# Patient Record
Sex: Female | Born: 1990
Health system: Southern US, Community
[De-identification: ages and names within clinical notes are randomized; demographics above are authoritative.]

## PROBLEM LIST (undated history)

## (undated) ENCOUNTER — Emergency Department (HOSPITAL_BASED_OUTPATIENT_CLINIC_OR_DEPARTMENT_OTHER): Admission: EM | Payer: Self-pay

## (undated) DIAGNOSIS — G43909 Migraine, unspecified, not intractable, without status migrainosus: Secondary | ICD-10-CM

## (undated) DIAGNOSIS — F32A Depression, unspecified: Secondary | ICD-10-CM

## (undated) HISTORY — PX: WISDOM TOOTH EXTRACTION: SHX21

---
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
## 2015-09-26 ENCOUNTER — Emergency Department (HOSPITAL_BASED_OUTPATIENT_CLINIC_OR_DEPARTMENT_OTHER)
Admission: EM | Admit: 2015-09-26 | Discharge: 2015-09-26 | Disposition: A | Discharge status: Home / Self Care | Payer: Medicaid Other | Attending: Emergency Medicine | Admitting: Emergency Medicine

## 2015-09-26 ENCOUNTER — Encounter (HOSPITAL_BASED_OUTPATIENT_CLINIC_OR_DEPARTMENT_OTHER): Payer: Self-pay | Admitting: Emergency Medicine

## 2015-09-26 ENCOUNTER — Emergency Department (HOSPITAL_BASED_OUTPATIENT_CLINIC_OR_DEPARTMENT_OTHER): Payer: Medicaid Other

## 2015-09-26 DIAGNOSIS — Z79899 Other long term (current) drug therapy: Secondary | ICD-10-CM | POA: Insufficient documentation

## 2015-09-26 DIAGNOSIS — J988 Other specified respiratory disorders: Secondary | ICD-10-CM | POA: Diagnosis not present

## 2015-09-26 DIAGNOSIS — R0981 Nasal congestion: Secondary | ICD-10-CM | POA: Diagnosis present

## 2015-09-26 DIAGNOSIS — R059 Cough, unspecified: Secondary | ICD-10-CM

## 2015-09-26 DIAGNOSIS — R05 Cough: Secondary | ICD-10-CM

## 2015-09-26 DIAGNOSIS — R079 Chest pain, unspecified: Secondary | ICD-10-CM | POA: Insufficient documentation

## 2015-09-26 DIAGNOSIS — F17210 Nicotine dependence, cigarettes, uncomplicated: Secondary | ICD-10-CM | POA: Insufficient documentation

## 2015-09-26 MED ORDER — GUAIFENESIN 100 MG/5ML PO LIQD: 100.0000 mg | ORAL | Status: DC | PRN

## 2015-09-26 NOTE — ED Notes (Signed)
Updated patient about delay.  Offered drinks but patient reports she is ok.  Verbalized understanding. No distress noted.

## 2015-09-26 NOTE — Discharge Instructions (Signed)

## 2015-09-26 NOTE — ED Provider Notes (Signed)
CSN: [id number]     Arrival date & time 09/26/15  1213 History   First MD Initiated Contact with Patient 09/26/15 1336     Chief Complaint  Patient presents with  . Nasal Congestion   HPI  Mr. Montez MoritaCarter is a 24 year old female presenting with cough. She she reports a productive cough that began yesterday. The cough is productive of yellow sputum. She states that occasionally she coughs so hard that her chest hurts and she feels short of breath. She also notes that she recently had a prolonged sinus infection. She states that it was present for almost 2 weeks and resolved about a week ago. She reports sinus congestion and rhinorrhea with sinus infection but states he is not painting these any longer. She is only complaining of the cough today. She is not having any underlying pulmonary issues. She does smoke approximately one half pack of cigarettes a day. She states that she has continued to smoke though she is sick. Denies fevers, chills, headaches, ear pain, congestion, rhinorrhea, sore throat, neck pain, abdominal pain, nausea or vomiting.   History reviewed. No pertinent past medical history. History reviewed. No pertinent past surgical history. History reviewed. No pertinent family history. Social History  Substance Use Topics  . Smoking status: Current Every Day Smoker -- 0.50 packs/day    Types: Cigarettes  . Smokeless tobacco: None  . Alcohol Use: No   OB History    No data available     Review of Systems  Constitutional: Negative for fever and chills.  HENT: Negative for congestion, ear pain, rhinorrhea, sinus pressure and sore throat.   Eyes: Negative for discharge and redness.  Respiratory: Positive for cough and shortness of breath.   Cardiovascular: Positive for chest pain.  Gastrointestinal: Negative for nausea, vomiting and abdominal pain.  Musculoskeletal: Negative for myalgias and arthralgias.  Skin: Negative for rash.  Neurological: Negative for dizziness, syncope,  weakness and headaches.  All other systems reviewed and are negative.     Allergies  Review of patient's allergies indicates no known allergies.  Home Medications   Prior to Admission medications   Medication Sig Start Date End Date Taking? Authorizing Provider  dextromethorphan-guaiFENesin (MUCINEX DM) 30-600 MG 12hr tablet Take 1 tablet by mouth 2 (two) times daily.   Yes Historical Provider, MD  guaiFENesin (ROBITUSSIN) 100 MG/5ML liquid Take 5-10 mLs (100-200 mg total) by mouth every 4 (four) hours as needed for cough. 09/26/15   Jaeshawn Silvio, PA-C   BP 134/78 mmHg  Pulse 73  Temp(Src) 98.1 F (36.7 C) (Oral)  Resp 16  Ht 5\' 3"  (1.6 m)  Wt 150 lb (68.04 kg)  BMI 26.58 kg/m2  SpO2 100% Physical Exam  Constitutional: She appears well-developed and well-nourished. No distress.  Nontoxic-appearing  HENT:  Head: Normocephalic and atraumatic.  Nose: Mucosal edema present. No rhinorrhea. Right sinus exhibits no maxillary sinus tenderness and no frontal sinus tenderness. Left sinus exhibits no maxillary sinus tenderness and no frontal sinus tenderness.  Mouth/Throat: Oropharynx is clear and moist. No oropharyngeal exudate.  Eyes: Conjunctivae and EOM are normal. Pupils are equal, round, and reactive to light. Right eye exhibits no discharge. Left eye exhibits no discharge. No scleral icterus.  Neck: Normal range of motion. Neck supple.  Cardiovascular: Normal rate, regular rhythm and normal heart sounds.   Pulmonary/Chest: Effort normal and breath sounds normal. No respiratory distress. She has no wheezes. She has no rales. She exhibits no tenderness.  Breathing unlabored. No adventitious  breath sounds.  Abdominal: Soft. She exhibits no distension.  Musculoskeletal: Normal range of motion.  Moves all extremities spontaneously and walks with a steady gait  Lymphadenopathy:    She has no cervical adenopathy.  Neurological: She is alert. Coordination normal.  Skin: Skin is warm  and dry. No rash noted.  Psychiatric: She has a normal mood and affect. Her behavior is normal.  Nursing note and vitals reviewed.   ED Course  Procedures (including critical care time) Labs Review Labs Reviewed - No data to display  Imaging Review Dg Chest 2 View  09/26/2015  CLINICAL DATA:  Productive cough EXAM: CHEST  2 VIEW COMPARISON:  Report from 10/11/2002 chest radiograph (images not available). FINDINGS: Normal heart size. Normal mediastinal contour. No pneumothorax. No pleural effusion. Clear lungs, with no focal lung consolidation and no pulmonary edema. IMPRESSION: No active cardiopulmonary disease. Electronically Signed   By: Delbert Phenix M.D.   On: 09/26/2015 14:53   I have personally reviewed and evaluated these images and lab results as part of my medical decision-making.   EKG Interpretation None      MDM   Final diagnoses:  Cough  Respiratory infection   Patient presenting with cough 1 day. She states that occasionally she coughs so hard she gets short of breath and has chest pain. Symptoms resolved when she is done coughing. Recent history of a sinus infection that resolved about a week ago. Symptoms today are not similar to symptoms of her sinus infection. No other complaints today. Vital signs stable. Patient appears in no acute distress and is nontoxic. Nasal mucosal edema present. Oropharynx is clear without erythema or exudate. Breathing unlabored with normal lung sounds in all lung fields. Pt CXR negative for acute infiltrate. Patients symptoms are consistent with URI, likely viral etiology. Discussed that antibiotics are not indicated for viral infections. Pt will be discharged with symptomatic treatment including Robitussin.  Verbalizes understanding and is agreeable with plan. Pt is hemodynamically stable & in NAD prior to dc.     Rolm Gala Etola Mull, PA-C 09/26/15 1802  Vanetta Mulders, MD 09/27/15 1241

## 2015-09-26 NOTE — ED Notes (Signed)
Pt reports nasal congestion and cough x 3 days, states she just go over a severe sinus infection last week and within 3 days it returned

## 2017-08-14 ENCOUNTER — Encounter (HOSPITAL_BASED_OUTPATIENT_CLINIC_OR_DEPARTMENT_OTHER): Payer: Self-pay | Admitting: *Deleted

## 2017-08-14 ENCOUNTER — Emergency Department (HOSPITAL_BASED_OUTPATIENT_CLINIC_OR_DEPARTMENT_OTHER)
Admission: EM | Admit: 2017-08-14 | Discharge: 2017-08-14 | Disposition: A | Discharge status: Home / Self Care | Payer: Self-pay | Attending: Emergency Medicine | Admitting: Emergency Medicine

## 2017-08-14 ENCOUNTER — Emergency Department (HOSPITAL_BASED_OUTPATIENT_CLINIC_OR_DEPARTMENT_OTHER): Payer: Self-pay

## 2017-08-14 DIAGNOSIS — B9689 Other specified bacterial agents as the cause of diseases classified elsewhere: Secondary | ICD-10-CM | POA: Insufficient documentation

## 2017-08-14 DIAGNOSIS — R509 Fever, unspecified: Secondary | ICD-10-CM | POA: Insufficient documentation

## 2017-08-14 DIAGNOSIS — R0981 Nasal congestion: Secondary | ICD-10-CM | POA: Insufficient documentation

## 2017-08-14 DIAGNOSIS — J069 Acute upper respiratory infection, unspecified: Secondary | ICD-10-CM | POA: Insufficient documentation

## 2017-08-14 DIAGNOSIS — R110 Nausea: Secondary | ICD-10-CM | POA: Insufficient documentation

## 2017-08-14 DIAGNOSIS — N760 Acute vaginitis: Secondary | ICD-10-CM | POA: Insufficient documentation

## 2017-08-14 DIAGNOSIS — F17210 Nicotine dependence, cigarettes, uncomplicated: Secondary | ICD-10-CM | POA: Insufficient documentation

## 2017-08-14 DIAGNOSIS — R1031 Right lower quadrant pain: Secondary | ICD-10-CM | POA: Insufficient documentation

## 2017-08-14 LAB — CBC WITH DIFFERENTIAL/PLATELET
BASOS ABS: 0 10*3/uL (ref 0.0–0.1)
Basophils Relative: 0 %
EOS ABS: 0.1 10*3/uL (ref 0.0–0.7)
EOS PCT: 2 %
HCT: 37.9 % (ref 36.0–46.0)
HEMOGLOBIN: 12.5 g/dL (ref 12.0–15.0)
LYMPHS ABS: 1.6 10*3/uL (ref 0.7–4.0)
Lymphocytes Relative: 18 %
MCH: 29.7 pg (ref 26.0–34.0)
MCHC: 33 g/dL (ref 30.0–36.0)
MCV: 90 fL (ref 78.0–100.0)
Monocytes Absolute: 0.8 10*3/uL (ref 0.1–1.0)
Monocytes Relative: 9 %
NEUTROS PCT: 71 %
Neutro Abs: 6.3 10*3/uL (ref 1.7–7.7)
PLATELETS: 209 10*3/uL (ref 150–400)
RBC: 4.21 MIL/uL (ref 3.87–5.11)
RDW: 12.7 % (ref 11.5–15.5)
WBC: 8.8 10*3/uL (ref 4.0–10.5)

## 2017-08-14 LAB — URINALYSIS, MICROSCOPIC (REFLEX)

## 2017-08-14 LAB — COMPREHENSIVE METABOLIC PANEL
ALK PHOS: 55 U/L (ref 38–126)
ALT: 12 U/L — AB (ref 14–54)
AST: 16 U/L (ref 15–41)
Albumin: 3.8 g/dL (ref 3.5–5.0)
Anion gap: 8 (ref 5–15)
BUN: 9 mg/dL (ref 6–20)
CALCIUM: 8.9 mg/dL (ref 8.9–10.3)
CHLORIDE: 106 mmol/L (ref 101–111)
CO2: 24 mmol/L (ref 22–32)
CREATININE: 0.59 mg/dL (ref 0.44–1.00)
GFR calc non Af Amer: >60 mL/min (ref >60)
GLUCOSE: 95 mg/dL (ref 65–99)
Potassium: 4 mmol/L (ref 3.5–5.1)
SODIUM: 138 mmol/L (ref 135–145)
Total Bilirubin: 0.7 mg/dL (ref 0.3–1.2)
Total Protein: 6.9 g/dL (ref 6.5–8.1)

## 2017-08-14 LAB — PREGNANCY, URINE: PREG TEST UR: NEGATIVE

## 2017-08-14 LAB — URINALYSIS, ROUTINE W REFLEX MICROSCOPIC
BILIRUBIN URINE: NEGATIVE
Glucose, UA: NEGATIVE mg/dL
Hgb urine dipstick: NEGATIVE
KETONES UR: NEGATIVE mg/dL
NITRITE: NEGATIVE
PH: 7.5 (ref 5.0–8.0)
Protein, ur: NEGATIVE mg/dL

## 2017-08-14 LAB — WET PREP, GENITAL
SPERM: NONE SEEN
Trich, Wet Prep: NONE SEEN
YEAST WET PREP: NONE SEEN

## 2017-08-14 LAB — RAPID STREP SCREEN (MED CTR MEBANE ONLY): Streptococcus, Group A Screen (Direct): NEGATIVE

## 2017-08-14 MED ORDER — BENZONATATE 100 MG PO CAPS: 100.0000 mg | ORAL_CAPSULE | Freq: Three times a day (TID) | ORAL | 0 refills | Status: DC

## 2017-08-14 MED ORDER — IOPAMIDOL (ISOVUE-300) INJECTION 61%
100.0000 mL | Freq: Once | INTRAVENOUS | Status: AC | PRN
  Administered 2017-08-14: 100 mL via INTRAVENOUS

## 2017-08-14 MED ORDER — ACETAMINOPHEN 325 MG PO TABS
650.0000 mg | ORAL_TABLET | Freq: Once | ORAL | Status: AC
  Administered 2017-08-14: 650 mg via ORAL
  Filled 2017-08-14: qty 2

## 2017-08-14 MED ORDER — METRONIDAZOLE 500 MG PO TABS: 500.0000 mg | ORAL_TABLET | Freq: Two times a day (BID) | ORAL | 0 refills | Status: DC

## 2017-08-14 MED ORDER — ACETAMINOPHEN 325 MG PO TABS
650.0000 mg | ORAL_TABLET | Freq: Once | ORAL | Status: DC
  Filled 2017-08-14: qty 2

## 2017-08-14 MED ORDER — SODIUM CHLORIDE 0.9 % IV BOLUS (SEPSIS)
1000.0000 mL | Freq: Once | INTRAVENOUS | Status: AC
  Administered 2017-08-14: 1000 mL via INTRAVENOUS

## 2017-08-14 MED ORDER — PSEUDOEPHEDRINE HCL 30 MG PO TABS: 30.0000 mg | ORAL_TABLET | ORAL | 0 refills | Status: DC | PRN

## 2017-08-14 MED FILL — SUDOGEST 30 MG TABLET: 30 | 5 days supply | Qty: 30 | Fill #0

## 2017-08-14 MED FILL — BENZONATATE 100 MG CAPSULE: 100 | 7 days supply | Qty: 21 | Fill #0

## 2017-08-14 MED FILL — metroNIDAZOLE 500 MG TABS: 500 | 7 days supply | Qty: 14 | Fill #0

## 2017-08-14 NOTE — ED Provider Notes (Signed)
[facility]   CSN: [id number] Arrival date & time: 08/14/17  1133     History   Chief Complaint Chief Complaint  Patient presents with  . Abdominal Pain    HPI Carol Pitts is a 26 y.o. female.  HPI Carol Pitts is a 26 y.o. female presents to ED with With nasal congestion, cough, sore throat, fever, chills for several days. Patient states she is also having severe right lower quadrant abdominal pain and nausea. No vomiting. Has been taking Tylenol with no relief of her symptoms. Denies any vaginal discharge or bleeding. Denies any diarrhea. Has not had flu shot this year yet. No recent sick contacts. Otherwise healthy and does not take any medications daily.   History reviewed. No pertinent past medical history.  There are no active problems to display for this patient.   History reviewed. No pertinent surgical history.  OB History    No data available       Home Medications    Prior to Admission medications   Medication Sig Start Date End Date Taking? Authorizing Provider  dextromethorphan-guaiFENesin (MUCINEX DM) 30-600 MG 12hr tablet Take 1 tablet by mouth 2 (two) times daily.    [provider]  guaiFENesin (ROBITUSSIN) 100 MG/5ML liquid Take 5-10 mLs (100-200 mg total) by mouth every 4 (four) hours as needed for cough. 09/26/15   Barrett, Rolm Gala, PA-C    Family History No family history on file.  Social History Social History  Substance Use Topics  . Smoking status: Current Every Day Smoker    Packs/day: 0.50    Types: Cigarettes  . Smokeless tobacco: Never Used  . Alcohol use No     Allergies   Patient has no known allergies.   Review of Systems Review of Systems  Constitutional: Positive for chills and fever.  HENT: Positive for congestion and sore throat.   Respiratory: Negative for cough, chest tightness and shortness of breath.   Cardiovascular: Negative for chest pain, palpitations and leg swelling.   Gastrointestinal: Positive for abdominal pain and nausea. Negative for diarrhea and vomiting.  Genitourinary: Positive for pelvic pain. Negative for dysuria, flank pain, vaginal bleeding, vaginal discharge and vaginal pain.  Musculoskeletal: Negative for arthralgias, myalgias, neck pain and neck stiffness.  Skin: Negative for rash.  Neurological: Negative for dizziness, weakness and headaches.  All other systems reviewed and are negative.    Physical Exam Updated Vital Signs BP 128/69 (BP Location: Left Arm)   Pulse 80   Temp 98.3 F (36.8 C) (Oral)   Resp 16   Ht  (1.6 m)   Wt 65.8 kg (145 lb)   LMP 07/25/2017   SpO2 100%   BMI 25.69 kg/m   Physical Exam  Constitutional: She is oriented to person, place, and time. She appears well-developed and well-nourished. No distress.  HENT:  Head: Normocephalic.  Right Ear: Tympanic membrane, external ear and ear canal normal.  Left Ear: Tympanic membrane, external ear and ear canal normal.  Nose: Mucosal edema and rhinorrhea present.  Mouth/Throat: Uvula is midline, oropharynx is clear and moist and mucous membranes are normal.  Eyes: Conjunctivae are normal.  Neck: Neck supple.  Cardiovascular: Normal rate, regular rhythm and normal heart sounds.   Pulmonary/Chest: Effort normal and breath sounds normal. No respiratory distress. She has no wheezes. She has no rales.  Abdominal: Soft. Bowel sounds are normal. She exhibits no distension. There is tenderness. There is no rebound and no guarding.  RLQ tenderness. TTP at [facility] point.   Genitourinary:  Genitourinary Comments: Normal external genitalia. Normal vaginal canal. Small thin white discharge. Cervix is normal, closed. No CMT. Right adnexal tenderness. No masses palpated.    Musculoskeletal: She exhibits no edema.  Neurological: She is alert and oriented to person, place, and time.  Skin: Skin is warm and dry.  Psychiatric: She has a normal mood and affect. Her  behavior is normal.  Nursing note and vitals reviewed.    ED Treatments / Results  Labs (all labs ordered are listed, but only abnormal results are displayed) Labs Reviewed  WET PREP, GENITAL - Abnormal; Notable for the following:       Result Value   Clue Cells Wet Prep HPF POC PRESENT (*)    WBC, Wet Prep HPF POC MANY (*)    All other components within normal limits  URINALYSIS, ROUTINE W REFLEX MICROSCOPIC - Abnormal; Notable for the following:    APPearance CLOUDY (*)    Specific Gravity, Urine <1.005 (*)    Leukocytes, UA TRACE (*)    All other components within normal limits  COMPREHENSIVE METABOLIC PANEL - Abnormal; Notable for the following:    ALT 12 (*)    All other components within normal limits  URINALYSIS, MICROSCOPIC (REFLEX) - Abnormal; Notable for the following:    Bacteria, UA FEW (*)    Squamous Epithelial / LPF 6-30 (*)    All other components within normal limits  RAPID STREP SCREEN (NOT AT [facility])  CULTURE, GROUP A STREP ([facility])  PREGNANCY, URINE  CBC WITH DIFFERENTIAL/PLATELET  GC/CHLAMYDIA PROBE AMP ([place]) NOT AT [facility]    EKG  EKG Interpretation None       Radiology Ct Abdomen Pelvis W Contrast  Result Date: 08/14/2017 CLINICAL DATA:  26 year old female with 4 day history of right lower quadrant pain EXAM: CT ABDOMEN AND PELVIS WITH CONTRAST TECHNIQUE: Multidetector CT imaging of the abdomen and pelvis was performed using the standard protocol following bolus administration of intravenous contrast. CONTRAST:  ISOVUE-300 IOPAMIDOL (ISOVUE-300) INJECTION 61% COMPARISON:  None. FINDINGS: Lower chest: The lung bases are clear. Visualized cardiac structures are within normal limits for size. No pericardial effusion. Unremarkable visualized distal thoracic esophagus. Hepatobiliary: Normal hepatic contour and morphology. No discrete hepatic lesions. Normal appearance of the gallbladder. No intra or extrahepatic biliary ductal dilatation. Pancreas:  Unremarkable. No pancreatic ductal dilatation or surrounding inflammatory changes. Spleen: Normal in size without focal abnormality. Adrenals/Urinary Tract: Normal adrenal glands. The kidneys perfuse symmetrically bilaterally. No enhancing renal mass or hydronephrosis. 3 mm stone in the interpolar left kidney. Unremarkable ureters and bladder. Stomach/Bowel: No evidence of obstruction or focal bowel wall thickening. Normal appendix in the right lower quadrant. The terminal ileum is unremarkable. Vascular/Lymphatic: No significant vascular findings are present. No enlarged abdominal or pelvic lymph nodes. Reproductive: Uterus and bilateral adnexa are unremarkable. Small 1.8 cm follicular cyst in the right ovary is considered a normal finding in a reproductive age female. Other: No abdominal wall hernia or abnormality. No abdominopelvic ascites. Musculoskeletal: No acute or significant osseous findings. Chronic bilateral L5 pars defects. IMPRESSION: 1. No acute abnormality within the abdomen or pelvis to explain the patient's clinical symptoms. 2. Incidental note is made of a 3 mm nonobstructing stone in the left kidney. 3. Chronic bilateral L5 pars defects. No significant anterolisthesis. Electronically Signed   By: Malachy Moan M.D.   On: 08/14/2017 15:34    Procedures Procedures (including critical care time)  Medications  Ordered in ED Medications - No data to display   Initial Impression / Assessment and Plan / ED Course  I have reviewed the triage vital signs and the nursing notes.  Pertinent labs & imaging results that were available during my care of the patient were reviewed by me and considered in my medical decision making (see chart for details).    Patient in emergency department with flulike symptoms, also complaining of severe right lower quadrant abdominal pain. Will perform pelvic exam, labs ordered. Pain is in [place]'s point. Discussed with Dr. Wynelle Beckmann, concerning for possible  appendicitis. Question whether this could be also abdominal wall pain from coughing. We will get a CT of abdomen and pelvis for further evaluation.    4:03 PM Pt's CT is negative. Symptoms consistent with URI, possibly influenza. Consider abdominal pain could be related to muscular pain from coughing. Will treat for BV based on wet prep. Plan to dc home with symptomatic tx. Return precautions discussed.   Vitals:   08/14/17 1137 08/14/17 1346 08/14/17 1557  BP: 128/69 109/69 115/78  Pulse: 80 66 60  Resp: Temp: 98.3 F (36.8 C) 98.4 F (36.9 C) 98.4 F (36.9 C)  TempSrc: Oral Oral Oral  SpO2: 100% 99% 99%  Weight: 65.8 kg (145 lb)    Height:  (1.6 m)       Final Clinical Impressions(s) / ED Diagnoses   Final diagnoses:  Viral upper respiratory tract infection  Right lower quadrant abdominal pain  Bacterial vaginosis    New Prescriptions New Prescriptions   BENZONATATE (TESSALON) 100 MG CAPSULE    Take 1 capsule (100 mg total) by mouth every 8 (eight) hours.   METRONIDAZOLE (FLAGYL) 500 MG TABLET    Take 1 tablet (500 mg total) by mouth 2 (two) times daily.   PSEUDOEPHEDRINE (SUDAFED) 30 MG TABLET    Take 1 tablet (30 mg total) by mouth every 4 (four) hours as needed for congestion.     Jaynie Crumble, PA-C 08/14/17 1640    Vanetta Mulders, MD 08/17/17 [phone]

## 2017-08-14 NOTE — Discharge Instructions (Signed)
Rest. Drink plenty of fluids. Take ibuprofen and Tylenol for any pain and fever. Take Flagyl as prescribed until all gone for bacterial vaginosis infection. Do not drink alcohol while taking this medication because it will make you sick. Take Sudafed and Tessalon as needed for congestion and cough. Follow-up with family doctor. Return if worsening symptoms.

## 2017-08-14 NOTE — ED Triage Notes (Signed)
Cough, runny nose, chest congestion, back and abdominal pain.

## 2017-08-15 LAB — GC/CHLAMYDIA PROBE AMP (~~LOC~~) NOT AT ARMC
CHLAMYDIA, DNA PROBE: NEGATIVE
NEISSERIA GONORRHEA: NEGATIVE

## 2017-08-17 LAB — CULTURE, GROUP A STREP (THRC)

## 2018-01-28 ENCOUNTER — Emergency Department (HOSPITAL_BASED_OUTPATIENT_CLINIC_OR_DEPARTMENT_OTHER)
Admission: EM | Admit: 2018-01-28 | Discharge: 2018-01-29 | Disposition: A | Discharge status: Home / Self Care | Payer: Medicaid Other | Attending: Emergency Medicine | Admitting: Emergency Medicine

## 2018-01-28 ENCOUNTER — Encounter (HOSPITAL_BASED_OUTPATIENT_CLINIC_OR_DEPARTMENT_OTHER): Payer: Self-pay | Admitting: *Deleted

## 2018-01-28 ENCOUNTER — Other Ambulatory Visit: Payer: Self-pay

## 2018-01-28 ENCOUNTER — Emergency Department (HOSPITAL_BASED_OUTPATIENT_CLINIC_OR_DEPARTMENT_OTHER): Payer: Medicaid Other

## 2018-01-28 DIAGNOSIS — Z3A01 Less than 8 weeks gestation of pregnancy: Secondary | ICD-10-CM | POA: Diagnosis not present

## 2018-01-28 DIAGNOSIS — R197 Diarrhea, unspecified: Secondary | ICD-10-CM

## 2018-01-28 DIAGNOSIS — O200 Threatened abortion: Secondary | ICD-10-CM | POA: Diagnosis not present

## 2018-01-28 DIAGNOSIS — R1031 Right lower quadrant pain: Secondary | ICD-10-CM

## 2018-01-28 DIAGNOSIS — R112 Nausea with vomiting, unspecified: Secondary | ICD-10-CM

## 2018-01-28 DIAGNOSIS — R8271 Bacteriuria: Secondary | ICD-10-CM | POA: Insufficient documentation

## 2018-01-28 DIAGNOSIS — O9989 Other specified diseases and conditions complicating pregnancy, childbirth and the puerperium: Secondary | ICD-10-CM | POA: Insufficient documentation

## 2018-01-28 DIAGNOSIS — Z79899 Other long term (current) drug therapy: Secondary | ICD-10-CM | POA: Insufficient documentation

## 2018-01-28 DIAGNOSIS — Z87891 Personal history of nicotine dependence: Secondary | ICD-10-CM | POA: Diagnosis not present

## 2018-01-28 DIAGNOSIS — R6883 Chills (without fever): Secondary | ICD-10-CM | POA: Diagnosis present

## 2018-01-28 LAB — URINALYSIS, ROUTINE W REFLEX MICROSCOPIC
Bilirubin Urine: NEGATIVE
GLUCOSE, UA: NEGATIVE mg/dL
Hgb urine dipstick: NEGATIVE
LEUKOCYTES UA: NEGATIVE
NITRITE: POSITIVE — AB
PH: 6 (ref 5.0–8.0)
Protein, ur: 30 mg/dL — AB
Specific Gravity, Urine: >1.03 — ABNORMAL HIGH (ref 1.005–1.030)

## 2018-01-28 LAB — URINALYSIS, MICROSCOPIC (REFLEX)

## 2018-01-28 LAB — PREGNANCY, URINE: Preg Test, Ur: POSITIVE — AB

## 2018-01-28 MED ORDER — ACETAMINOPHEN 325 MG PO TABS
650.0000 mg | ORAL_TABLET | Freq: Once | ORAL | Status: AC
  Administered 2018-01-28: 650 mg via ORAL
  Filled 2018-01-28: qty 2

## 2018-01-28 MED ORDER — SODIUM CHLORIDE 0.9 % IV BOLUS
1000.0000 mL | Freq: Once | INTRAVENOUS | Status: AC
  Administered 2018-01-29: 1000 mL via INTRAVENOUS

## 2018-01-28 MED ORDER — ONDANSETRON HCL 4 MG/2ML IJ SOLN
4.0000 mg | Freq: Once | INTRAMUSCULAR | Status: AC
  Administered 2018-01-29: 4 mg via INTRAVENOUS
  Filled 2018-01-28: qty 2

## 2018-01-28 NOTE — ED Notes (Signed)
Pt reports she is possibly 3 months pregnant, next OB appt is wed.

## 2018-01-28 NOTE — ED Provider Notes (Signed)
[facility] EMERGENCY DEPARTMENT Provider Note   CSN: [id number] Arrival date & time: 01/28/18  1823     History   Chief Complaint Chief Complaint  Patient presents with  . Chills    HPI Carol Pitts is a 26 y.o. female.  Carol N Liberati is a 27 y.o. Female who is possibly 3 months pregnant otherwise healthy, presents to the ED for evaluation of 24 hours of nausea, vomiting and diarrhea.  Patient reports symptoms started yesterday, she has had several episodes of nonbloody nonbilious emesis, and a few loose stools, no hematochezia or melena.  Patient does report some right lower quadrant abdominal pain that is intermittent.  She also reports subjective fevers and chills.  She denies any dysuria, frequency, flank pain or hematuria.  Denies any vaginal bleeding, does report small amount of vaginal discharge which she thinks is likely physiologic.  Patient reports generalized body aches, denies associated cough, nasal congestion, sore throat, no known contacts with the flu, did not have her flu shot.  This will be patient's third pregnancy, no prior complications.  Unsure of exact LMP, but thinks it was sometime in December.  She has tried Tylenol has not taken anything else for her symptoms.  Has not seen OB/GYN yet for any prenatal care, has upcoming appointment in 2 days.      History reviewed. No pertinent past medical history.  There are no active problems to display for this patient.   History reviewed. No pertinent surgical history.   OB History    Gravida  1   Para      Term      Preterm      AB      Living        SAB      TAB      Ectopic      Multiple      Live Births               Home Medications    Prior to Admission medications   Medication Sig Start Date End Date Taking? Authorizing Provider  benzonatate (TESSALON) 100 MG capsule Take 1 capsule (100 mg total) by mouth every 8 (eight) hours. 08/14/17   Kirichenko, Lemont Fillers, PA-C    dextromethorphan-guaiFENesin (MUCINEX DM) 30-600 MG 12hr tablet Take 1 tablet by mouth 2 (two) times daily.    [provider]  guaiFENesin (ROBITUSSIN) 100 MG/5ML liquid Take 5-10 mLs (100-200 mg total) by mouth every 4 (four) hours as needed for cough. 09/26/15   Barrett, Rolm Gala, PA-C  metroNIDAZOLE (FLAGYL) 500 MG tablet Take 1 tablet (500 mg total) by mouth 2 (two) times daily. 08/14/17   Kirichenko, Tatyana, PA-C  pseudoephedrine (SUDAFED) 30 MG tablet Take 1 tablet (30 mg total) by mouth every 4 (four) hours as needed for congestion. 08/14/17   Jaynie Crumble, PA-C    Family History No family history on file.  Social History Social History   Tobacco Use  . Smoking status: Former Smoker    Packs/day: 0.50    Types: Cigarettes  . Smokeless tobacco: Never Used  Substance Use Topics  . Alcohol use: No  . Drug use: Never     Allergies   Patient has no known allergies.   Review of Systems Review of Systems  Constitutional: Positive for chills and fever.  HENT: Negative for congestion, ear pain, rhinorrhea and sore throat.   Respiratory: Negative for cough, shortness of breath and wheezing.   Cardiovascular: Negative  for chest pain.  Gastrointestinal: Positive for abdominal pain, diarrhea, nausea and vomiting.  Genitourinary: Positive for pelvic pain and vaginal discharge. Negative for dysuria, flank pain, frequency, hematuria, vaginal bleeding and vaginal pain.  Musculoskeletal: Positive for myalgias. Negative for arthralgias.  Skin: Negative for color change and rash.  Neurological: Negative for dizziness, facial asymmetry, weakness and headaches.     Physical Exam Updated Vital Signs BP 117/69 (BP Location: Left Arm)   Pulse 71   Temp 100.1 F (37.8 C) (Oral)   Resp 18   Ht 5\' 3"  (1.6 m)   Wt 63.5 kg (140 lb)   LMP 10/30/2017   SpO2 98%   BMI 24.80 kg/m   Physical Exam  Constitutional: She appears well-developed and well-nourished. No distress.   HENT:  Head: Normocephalic and atraumatic.  Mouth/Throat: Oropharynx is clear and moist.  TMs clear with good landmarks, minimal nasal mucosa edema with clear rhinorrhea, posterior oropharynx clear and moist, without erythema, edema or exudates  Eyes: Right eye exhibits no discharge. Left eye exhibits no discharge.  Neck: Neck supple.  Cardiovascular: Normal rate, regular rhythm and normal heart sounds.  Pulmonary/Chest: Effort normal and breath sounds normal. No stridor. No respiratory distress. She has no wheezes. She has no rales.  Abdominal: Soft. Bowel sounds are normal. She exhibits no distension and no mass. There is tenderness. There is no rebound and no guarding.  Focal mild tenderness in the right lower quadrant without guarding or rebound tenderness, all other quadrants nontender to palpation, no CVA tenderness  Genitourinary:  Genitourinary Comments: Chaperone present for pelvic exam No external genital lesions Multiparous cervical os is closed, small amount of white discharge present at the office and in the vaginal vault, no POC noted On bimanual exam there is no cervical motion tenderness mild right-sided tenderness consistent with abdominal exam, no left-sided adnexal tenderness  Neurological: She is alert. Coordination normal.  Skin: Skin is warm and dry. Capillary refill takes less than 2 seconds. She is not diaphoretic.  Psychiatric: She has a normal mood and affect. Her behavior is normal.  Nursing note and vitals reviewed.    ED Treatments / Results  Labs (all labs ordered are listed, but only abnormal results are displayed) Labs Reviewed  BASIC METABOLIC PANEL - Abnormal; Notable for the following components:      Result Value   Sodium 134 (*)    Potassium 3.1 (*)    CO2 18 (*)    All other components within normal limits  PREGNANCY, URINE - Abnormal; Notable for the following components:   Preg Test, Ur POSITIVE (*)    All other components within normal  limits  URINALYSIS, ROUTINE W REFLEX MICROSCOPIC - Abnormal; Notable for the following components:   APPearance HAZY (*)    Specific Gravity, Urine >1.030 (*)    Ketones, ur >80 (*)    Protein, ur 30 (*)    Nitrite POSITIVE (*)    All other components within normal limits  URINALYSIS, MICROSCOPIC (REFLEX) - Abnormal; Notable for the following components:   Bacteria, UA MANY (*)    Squamous Epithelial / LPF 0-5 (*)    All other components within normal limits  WET PREP, GENITAL  CBC WITH DIFFERENTIAL/PLATELET  HCG, QUANTITATIVE, PREGNANCY  RPR  HIV ANTIBODY (ROUTINE TESTING)  GC/CHLAMYDIA PROBE AMP ([place]) NOT AT [facility]    EKG None  Radiology US Ob Comp < 14 Wks  Result Date: 01/29/2018 CLINICAL DATA:  Initial evaluation for acute right lower  quadrant pain. Early pregnancy. EXAM: OBSTETRIC <14 WK [place] AND TRANSVAGINAL OB US TECHNIQUE: Both transabdominal and transvaginal ultrasound examinations were performed for complete evaluation of the gestation as well as the maternal uterus, adnexal regions, and pelvic cul-de-sac. Transvaginal technique was performed to assess early pregnancy. COMPARISON:  None. FINDINGS: Intrauterine gestational sac: Single Yolk sac:  Present Embryo:  Present Cardiac Activity: Not visualized. Heart Rate: N/A  bpm CRL:  5.0 mm   6 w   2 d                  [place] EDC: 09/18/2018 Subchorionic hemorrhage: Small to moderate subchorionic hemorrhage without significant mass effect. Maternal uterus/adnexae: Left ovary is normal in appearance. Small corpus luteal cyst noted within the right ovary. Small volume free fluid present within the pelvis. IMPRESSION: 1. Single intrauterine gestational sac containing a yolk sac and embryo, but no cardiac activity visualized. Crown-rump length equals 5 mm. Findings are suspicious but not yet definitive for failed pregnancy. Recommend follow-up US in 10-14 days for definitive diagnosis. This recommendation follows SRU consensus guidelines:  Diagnostic Criteria for Nonviable Pregnancy Early in the First Trimester. Malva Limes Med 2013; 161:0960-45. 2. Small to moderate subchorionic hemorrhage without associated mass effect. 3. No other acute maternal uterine or adnexal abnormality identified. Electronically Signed   By: Rise Mu M.D.   On: 01/29/2018 00:23   US Ob Transvaginal  Result Date: 01/29/2018 CLINICAL DATA:  Initial evaluation for acute right lower quadrant pain. Early pregnancy. EXAM: OBSTETRIC <14 WK [place] AND TRANSVAGINAL OB US TECHNIQUE: Both transabdominal and transvaginal ultrasound examinations were performed for complete evaluation of the gestation as well as the maternal uterus, adnexal regions, and pelvic cul-de-sac. Transvaginal technique was performed to assess early pregnancy. COMPARISON:  None. FINDINGS: Intrauterine gestational sac: Single Yolk sac:  Present Embryo:  Present Cardiac Activity: Not visualized. Heart Rate: N/A  bpm CRL:  5.0 mm   6 w   2 d                  [place] EDC: 09/18/2018 Subchorionic hemorrhage: Small to moderate subchorionic hemorrhage without significant mass effect. Maternal uterus/adnexae: Left ovary is normal in appearance. Small corpus luteal cyst noted within the right ovary. Small volume free fluid present within the pelvis. IMPRESSION: 1. Single intrauterine gestational sac containing a yolk sac and embryo, but no cardiac activity visualized. Crown-rump length equals 5 mm. Findings are suspicious but not yet definitive for failed pregnancy. Recommend follow-up US in 10-14 days for definitive diagnosis. This recommendation follows SRU consensus guidelines: Diagnostic Criteria for Nonviable Pregnancy Early in the First Trimester. Malva Limes Med 2013; 409:8119-14. 2. Small to moderate subchorionic hemorrhage without associated mass effect. 3. No other acute maternal uterine or adnexal abnormality identified. Electronically Signed   By: Rise Mu M.D.   On: 01/29/2018 00:23     Procedures Procedures (including critical care time)  Medications Ordered in ED Medications  sodium chloride 0.9 % bolus 1,000 mL (1,000 mLs Intravenous Given 01/29/18 0008)  potassium chloride SA (K-DUR,KLOR-CON) CR tablet 40 mEq (has no administration in time range)  acetaminophen (TYLENOL) tablet 650 mg (650 mg Oral Given 01/28/18 2210)  ondansetron (ZOFRAN) injection 4 mg (4 mg Intravenous Given 01/29/18 0008)     Initial Impression / Assessment and Plan / ED Course  I have reviewed the triage vital signs and the nursing notes.  Pertinent labs & imaging results that were available during my care of the patient  were reviewed by me and considered in my medical decision making (see chart for details).  Patient reports she is approximately 3 months pregnant, presents for evaluation of 24 hours of nausea, vomiting and diarrhea, also reports some focal right lower quadrant pain.  Small amount of vaginal discharge but no vaginal bleeding.  No OB care yet this pregnancy.  On initial evaluation patient with normal vitals, abdomen with focal right lower quadrant tenderness that is mild, no guarding or rebound tenderness, less concerning for appendicitis, but given the patient is pregnant will get pelvic ultrasound and labs.  Pelvic exam with small amount of discharge, not concerning for PID.  Presentation is very much suggestive of viral enteritis with low-grade fever, nausea vomiting and diarrhea.  Patient provided fluids and Zofran here in the emergency department with improvement in symptoms.  Labs reviewed and interpreted by myself, overall reassuring.  There is no leukocytosis, hemoglobin normal, potassium is 3.1, oral replacement given here in the ED, no other electrolyte derangements requiring intervention, normal kidney function.  Urine is positive for nitrates and shows many bacteria, will treat with Keflex for asymptomatic bacteriuria as she is pregnant.  There are ketones present  suggesting some degree of dehydration.  Quantitative hCG [id number].  Wet prep shows many WBCs, GC chlamydia, HIV and RPR pending. Will hold off on prophylactic treatment since pt has follow up in two days.  Ultrasound shows single intrauterine gestational sac containing yolk sac and embryo but there is no cardiac activity visualized, findings are suspicious but not yet definitive for failed pregnancy.  Follow-up ultrasound recommended, these results were discussed with the patient, she has upcoming OB/GYN appointment in 2 days.  At this time patient is stable for discharge home, patient tolerating p.o. here in the ED and reports overall she is feeling better.  Will discharge with prescription for Zofran, patient provided copy of her lab work and ultrasound report and she has upcoming follow-up appointment with her OB/GYN in 2 days.  Strict return precautions discussed.  Patient expresses understanding and is in agreement with plan.  Patient discussed with Dr. Fayrene FearingJames who saw and evaluated the patient as well and agrees with plan.  Final Clinical Impressions(s) / ED Diagnoses   Final diagnoses:  Less than [redacted] weeks gestation of pregnancy  Threatened miscarriage in early pregnancy  Nausea vomiting and diarrhea  Right lower quadrant abdominal pain  Asymptomatic bacteriuria during pregnancy    ED Discharge Orders        Ordered    cephALEXin (KEFLEX) 500 MG capsule  3 times daily     01/29/18 0110    ondansetron (ZOFRAN ODT) 4 MG disintegrating tablet     01/29/18 0110    Prenatal Vit-Fe Fumarate-FA (PRENATAL COMPLETE) 14-0.4 MG TABS  Daily     01/29/18 0110       Dartha LodgeFord, Scottie Metayer N, PA-C 01/29/18 0114    Rolland PorterJames, Mark, MD 01/29/18 1526

## 2018-01-28 NOTE — ED Triage Notes (Signed)
Yesterday she was nauseated. (She is pregnant. Unknown weeks). Body aches, no fever. Chills. She took Ibuprofen 2 hours ago.

## 2018-01-29 LAB — BASIC METABOLIC PANEL
Anion gap: 14 (ref 5–15)
BUN: 9 mg/dL (ref 6–20)
CALCIUM: 9.2 mg/dL (ref 8.9–10.3)
CO2: 18 mmol/L — AB (ref 22–32)
CREATININE: 0.6 mg/dL (ref 0.44–1.00)
Chloride: 102 mmol/L (ref 101–111)
GFR calc Af Amer: >60 mL/min (ref >60)
GFR calc non Af Amer: >60 mL/min (ref >60)
GLUCOSE: 93 mg/dL (ref 65–99)
Potassium: 3.1 mmol/L — ABNORMAL LOW (ref 3.5–5.1)
Sodium: 134 mmol/L — ABNORMAL LOW (ref 135–145)

## 2018-01-29 LAB — HCG, QUANTITATIVE, PREGNANCY: HCG, BETA CHAIN, QUANT, S: 46272 m[IU]/mL — AB (ref <5)

## 2018-01-29 LAB — CBC WITH DIFFERENTIAL/PLATELET
BASOS PCT: 0 %
Basophils Absolute: 0 10*3/uL (ref 0.0–0.1)
Eosinophils Absolute: 0 10*3/uL (ref 0.0–0.7)
Eosinophils Relative: 0 %
HEMATOCRIT: 36.4 % (ref 36.0–46.0)
Hemoglobin: 12.3 g/dL (ref 12.0–15.0)
LYMPHS PCT: 15 %
Lymphs Abs: 1.1 10*3/uL (ref 0.7–4.0)
MCH: 30.2 pg (ref 26.0–34.0)
MCHC: 33.8 g/dL (ref 30.0–36.0)
MCV: 89.4 fL (ref 78.0–100.0)
MONO ABS: 0.7 10*3/uL (ref 0.1–1.0)
MONOS PCT: 9 %
NEUTROS ABS: 5.7 10*3/uL (ref 1.7–7.7)
Neutrophils Relative %: 76 %
Platelets: 217 10*3/uL (ref 150–400)
RBC: 4.07 MIL/uL (ref 3.87–5.11)
RDW: 12.3 % (ref 11.5–15.5)
WBC: 7.5 10*3/uL (ref 4.0–10.5)

## 2018-01-29 LAB — WET PREP, GENITAL
Clue Cells Wet Prep HPF POC: NONE SEEN
SPERM: NONE SEEN
Trich, Wet Prep: NONE SEEN
Yeast Wet Prep HPF POC: NONE SEEN

## 2018-01-29 LAB — GC/CHLAMYDIA PROBE AMP (~~LOC~~) NOT AT ARMC
CHLAMYDIA, DNA PROBE: NEGATIVE
Neisseria Gonorrhea: NEGATIVE

## 2018-01-29 MED ORDER — CEPHALEXIN 500 MG PO CAPS: 500.0000 mg | ORAL_CAPSULE | Freq: Three times a day (TID) | ORAL | 0 refills | Status: AC

## 2018-01-29 MED ORDER — ONDANSETRON 4 MG PO TBDP: ORAL_TABLET | ORAL | 0 refills | Status: DC

## 2018-01-29 MED ORDER — PRENATAL COMPLETE 14-0.4 MG PO TABS: 1.0000 | ORAL_TABLET | Freq: Every day | ORAL | 0 refills | Status: DC

## 2018-01-29 MED ORDER — POTASSIUM CHLORIDE CRYS ER 20 MEQ PO TBCR
40.0000 meq | EXTENDED_RELEASE_TABLET | Freq: Once | ORAL | Status: AC
  Administered 2018-01-29: 40 meq via ORAL
  Filled 2018-01-29: qty 2

## 2018-01-29 NOTE — ED Provider Notes (Signed)
Pt seen and evaluated. D/W PA Ford. Pt with N/V/D for 24 hours. Exam without significant TTP in abdomen. In nuclear.  Not a concerning exam for appendicitis.  Unfortunately, ultrasound shows 7 weeks fetal pole but no heart tones.  I discussed with her that she will need repeat ultrasound or quant this week, but likely represents fetal demise.  I did discuss the possibility of early IUP and pending heart tones.  She will undergo pelvic exam.  Will treat like enteritis with her having low-grade fever, nausea vomiting diarrhea.   Rolland PorterJames, Aerin Delany, MD 01/29/18 [phone]

## 2018-01-29 NOTE — ED Notes (Signed)
[facility] PAD NOT WORKING, NO QUESTIONS REGARDING D/C INSTRUCTIONS OR MEDS

## 2018-01-29 NOTE — Discharge Instructions (Signed)
Your urine showed some bacteria please take Keflex 3 times daily as directed.  You may use Zofran for nausea.  Ensure you are drinking plenty of fluids. Please begin taking prenatal vitamin.  As we discussed your ultrasound shows that your approximately 6-[redacted] weeks pregnant there was no heartbeat noted on ultrasound today, please follow-up for recheck at your OB/GYN appointment in 2 days.  Please return to the ED for worsening abdominal pain, persistent fevers, nausea vomiting and unable to keep down fluids, vaginal bleeding or any other new or concerning symptoms.

## 2018-01-30 LAB — HIV ANTIBODY (ROUTINE TESTING W REFLEX): HIV Screen 4th Generation wRfx: NONREACTIVE

## 2018-01-30 LAB — RPR: RPR Ser Ql: NONREACTIVE

## 2018-01-31 HISTORY — PX: DILATION AND EVACUATION: SHX1459

## 2018-05-05 ENCOUNTER — Emergency Department (HOSPITAL_BASED_OUTPATIENT_CLINIC_OR_DEPARTMENT_OTHER)
Admission: EM | Admit: 2018-05-05 | Discharge: 2018-05-05 | Disposition: A | Discharge status: Home / Self Care | Payer: Medicaid Other | Attending: Emergency Medicine | Admitting: Emergency Medicine

## 2018-05-05 ENCOUNTER — Other Ambulatory Visit: Payer: Self-pay

## 2018-05-05 ENCOUNTER — Emergency Department (HOSPITAL_BASED_OUTPATIENT_CLINIC_OR_DEPARTMENT_OTHER): Payer: Medicaid Other

## 2018-05-05 ENCOUNTER — Encounter (HOSPITAL_BASED_OUTPATIENT_CLINIC_OR_DEPARTMENT_OTHER): Payer: Self-pay | Admitting: Emergency Medicine

## 2018-05-05 DIAGNOSIS — Z87891 Personal history of nicotine dependence: Secondary | ICD-10-CM | POA: Diagnosis not present

## 2018-05-05 DIAGNOSIS — E860 Dehydration: Secondary | ICD-10-CM | POA: Diagnosis not present

## 2018-05-05 DIAGNOSIS — Z79899 Other long term (current) drug therapy: Secondary | ICD-10-CM | POA: Diagnosis not present

## 2018-05-05 DIAGNOSIS — O211 Hyperemesis gravidarum with metabolic disturbance: Secondary | ICD-10-CM | POA: Diagnosis not present

## 2018-05-05 DIAGNOSIS — R109 Unspecified abdominal pain: Secondary | ICD-10-CM | POA: Insufficient documentation

## 2018-05-05 DIAGNOSIS — Z3A01 Less than 8 weeks gestation of pregnancy: Secondary | ICD-10-CM | POA: Diagnosis not present

## 2018-05-05 DIAGNOSIS — O9989 Other specified diseases and conditions complicating pregnancy, childbirth and the puerperium: Secondary | ICD-10-CM | POA: Diagnosis not present

## 2018-05-05 DIAGNOSIS — R112 Nausea with vomiting, unspecified: Secondary | ICD-10-CM

## 2018-05-05 DIAGNOSIS — O219 Vomiting of pregnancy, unspecified: Secondary | ICD-10-CM | POA: Diagnosis present

## 2018-05-05 LAB — CBC WITH DIFFERENTIAL/PLATELET
BASOS ABS: 0 10*3/uL (ref 0.0–0.1)
BASOS PCT: 0 %
EOS ABS: 0 10*3/uL (ref 0.0–0.7)
Eosinophils Relative: 0 %
HCT: 41.8 % (ref 36.0–46.0)
HEMOGLOBIN: 14.4 g/dL (ref 12.0–15.0)
Lymphocytes Relative: 20 %
Lymphs Abs: 1.6 10*3/uL (ref 0.7–4.0)
MCH: 30.6 pg (ref 26.0–34.0)
MCHC: 34.4 g/dL (ref 30.0–36.0)
MCV: 88.7 fL (ref 78.0–100.0)
Monocytes Absolute: 0.4 10*3/uL (ref 0.1–1.0)
Monocytes Relative: 5 %
NEUTROS PCT: 75 %
Neutro Abs: 6 10*3/uL (ref 1.7–7.7)
Platelets: 238 10*3/uL (ref 150–400)
RBC: 4.71 MIL/uL (ref 3.87–5.11)
RDW: 12.4 % (ref 11.5–15.5)
WBC: 7.9 10*3/uL (ref 4.0–10.5)

## 2018-05-05 LAB — URINALYSIS, ROUTINE W REFLEX MICROSCOPIC
Glucose, UA: NEGATIVE mg/dL
Hgb urine dipstick: NEGATIVE
Leukocytes, UA: NEGATIVE
NITRITE: NEGATIVE
Protein, ur: NEGATIVE mg/dL
Specific Gravity, Urine: >1.03 — ABNORMAL HIGH (ref 1.005–1.030)
pH: 6 (ref 5.0–8.0)

## 2018-05-05 LAB — COMPREHENSIVE METABOLIC PANEL
ALBUMIN: 4.6 g/dL (ref 3.5–5.0)
ALK PHOS: 42 U/L (ref 38–126)
ALT: 13 U/L (ref 0–44)
ANION GAP: 12 (ref 5–15)
AST: 23 U/L (ref 15–41)
BUN: 15 mg/dL (ref 6–20)
CALCIUM: 9.1 mg/dL (ref 8.9–10.3)
CO2: 19 mmol/L — AB (ref 22–32)
Chloride: 103 mmol/L (ref 98–111)
Creatinine, Ser: 0.66 mg/dL (ref 0.44–1.00)
GFR calc Af Amer: >60 mL/min (ref >60)
GFR calc non Af Amer: >60 mL/min (ref >60)
GLUCOSE: 70 mg/dL (ref 70–99)
Potassium: 4.3 mmol/L (ref 3.5–5.1)
SODIUM: 134 mmol/L — AB (ref 135–145)
Total Bilirubin: 1.1 mg/dL (ref 0.3–1.2)
Total Protein: 7.8 g/dL (ref 6.5–8.1)

## 2018-05-05 LAB — LIPASE, BLOOD: Lipase: 35 U/L (ref 11–51)

## 2018-05-05 LAB — HCG, QUANTITATIVE, PREGNANCY: hCG, Beta Chain, Quant, S: 49913 m[IU]/mL — ABNORMAL HIGH (ref <5)

## 2018-05-05 MED ORDER — ONDANSETRON HCL 4 MG PO TABS: 4.0000 mg | ORAL_TABLET | Freq: Three times a day (TID) | ORAL | 0 refills | Status: DC | PRN

## 2018-05-05 MED ORDER — SODIUM CHLORIDE 0.9 % IV BOLUS
1000.0000 mL | Freq: Once | INTRAVENOUS | Status: AC
Start: 2018-05-05 — End: 2018-05-05
  Administered 2018-05-05: 1000 mL via INTRAVENOUS

## 2018-05-05 MED ORDER — ONDANSETRON HCL 4 MG/2ML IJ SOLN
4.0000 mg | Freq: Once | INTRAMUSCULAR | Status: AC
  Administered 2018-05-05: 4 mg via INTRAVENOUS
  Filled 2018-05-05: qty 2

## 2018-05-05 NOTE — ED Triage Notes (Signed)
Pt states she is a few weeks pregnant. Has not been seen by OB. Was called in some diclegis for nausea. States she has had vomiting for 4 days and the meds are not helping.

## 2018-05-05 NOTE — ED Notes (Signed)
Patient transported to Ultrasound 

## 2018-05-05 NOTE — ED Provider Notes (Signed)
[facility] EMERGENCY DEPARTMENT Provider Note   CSN: [id number] Arrival date & time: 05/05/18  7829     History   Chief Complaint Chief Complaint  Patient presents with  . Emesis    pregnant    HPI Carol Pitts is a 27 y.o. female.  The history is provided by the patient, medical records and a significant other. No language interpreter was used.  Emesis   This is a new problem. The current episode started more than 2 days ago. The problem occurs continuously. The problem has not changed since onset.The emesis has an appearance of stomach contents. There has been no fever. Associated symptoms include abdominal pain. Pertinent negatives include no chills, no cough, no diarrhea, no fever, no headaches and no sweats.    History reviewed. No pertinent past medical history.  There are no active problems to display for this patient.   History reviewed. No pertinent surgical history.   OB History    Gravida  4   Para      Term      Preterm      AB  1   Living        SAB      TAB      Ectopic      Multiple      Live Births  2            Home Medications    Prior to Admission medications   Medication Sig Start Date End Date Taking? Authorizing Provider  Doxylamine-Pyridoxine (DICLEGIS PO) Take by mouth.   Yes [provider]  benzonatate (TESSALON) 100 MG capsule Take 1 capsule (100 mg total) by mouth every 8 (eight) hours. 08/14/17   Kirichenko, Lemont Fillers, PA-C  dextromethorphan-guaiFENesin (MUCINEX DM) 30-600 MG 12hr tablet Take 1 tablet by mouth 2 (two) times daily.    [provider]  guaiFENesin (ROBITUSSIN) 100 MG/5ML liquid Take 5-10 mLs (100-200 mg total) by mouth every 4 (four) hours as needed for cough. 09/26/15   Barrett, Rolm Gala, PA-C  metroNIDAZOLE (FLAGYL) 500 MG tablet Take 1 tablet (500 mg total) by mouth 2 (two) times daily. 08/14/17   Kirichenko, Lemont Fillers, PA-C  ondansetron (ZOFRAN ODT) 4 MG disintegrating tablet  4mg  ODT q4 hours prn nausea/vomit 01/29/18   Dartha Lodge, PA-C  Prenatal Vit-Fe Fumarate-FA (PRENATAL COMPLETE) 14-0.4 MG TABS Take 1 tablet by mouth daily. 01/29/18   Dartha Lodge, PA-C  pseudoephedrine (SUDAFED) 30 MG tablet Take 1 tablet (30 mg total) by mouth every 4 (four) hours as needed for congestion. 08/14/17   Jaynie Crumble, PA-C    Family History No family history on file.  Social History Social History   Tobacco Use  . Smoking status: Former Smoker    Packs/day: 0.50    Types: Cigarettes  . Smokeless tobacco: Never Used  Substance Use Topics  . Alcohol use: No  . Drug use: Never     Allergies   Patient has no known allergies.   Review of Systems Review of Systems  Constitutional: Negative for chills, diaphoresis, fatigue and fever.  HENT: Negative for congestion.   Eyes: Negative for visual disturbance.  Respiratory: Negative for cough, choking, chest tightness, shortness of breath and wheezing.   Cardiovascular: Negative for chest pain and palpitations.  Gastrointestinal: Positive for abdominal pain, nausea and vomiting. Negative for constipation and diarrhea.  Genitourinary: Negative for decreased urine volume, dysuria, flank pain, frequency, hematuria, pelvic pain, vaginal bleeding, vaginal discharge and vaginal  pain.  Musculoskeletal: Negative for back pain, neck pain and neck stiffness.  Skin: Negative for rash and wound.  Neurological: Negative for light-headedness and headaches.  Psychiatric/Behavioral: Negative for agitation.  All other systems reviewed and are negative.    Physical Exam Updated Vital Signs BP 118/76 (BP Location: Left Arm)   Pulse 80   Temp 98.3 F (36.8 C) (Oral)   Resp 16   Ht 5\' 3"  (1.6 m)   Wt 68 kg (150 lb)   LMP 03/17/2018   SpO2 99%   BMI 26.57 kg/m   Physical Exam  Constitutional: She appears well-developed and well-nourished. No distress.  HENT:  Head: Normocephalic and atraumatic.  Nose: Nose normal.    Mouth/Throat: Oropharynx is clear and moist. No oropharyngeal exudate.  Eyes: Pupils are equal, round, and reactive to light. Conjunctivae and EOM are normal.  Neck: Normal range of motion.  Cardiovascular: Normal rate and intact distal pulses.  No murmur heard. Pulmonary/Chest: Effort normal and breath sounds normal. No respiratory distress. She has no wheezes. She exhibits no tenderness.  Abdominal: Soft. Bowel sounds are normal. She exhibits no distension. There is no tenderness. There is no guarding.  Musculoskeletal: She exhibits no edema, tenderness or deformity.  Neurological: She is alert. No sensory deficit. She exhibits normal muscle tone.  Skin: Capillary refill takes less than 2 seconds. No rash noted. She is not diaphoretic. No erythema.  Psychiatric: She has a normal mood and affect.  Nursing note and vitals reviewed.    ED Treatments / Results  Labs (all labs ordered are listed, but only abnormal results are displayed) Labs Reviewed  URINALYSIS, ROUTINE W REFLEX MICROSCOPIC - Abnormal; Notable for the following components:      Result Value   Specific Gravity, Urine >1.030 (*)    Bilirubin Urine SMALL (*)    Ketones, ur >80 (*)    All other components within normal limits  COMPREHENSIVE METABOLIC PANEL - Abnormal; Notable for the following components:   Sodium 134 (*)    CO2 19 (*)    All other components within normal limits  HCG, QUANTITATIVE, PREGNANCY - Abnormal; Notable for the following components:   hCG, Beta Chain, Quant, S 98,11949,913 (*)    All other components within normal limits  URINE CULTURE  CBC WITH DIFFERENTIAL/PLATELET  LIPASE, BLOOD    EKG None  Radiology [place] Ob Comp < 14 Wks  Result Date: 05/05/2018 CLINICAL DATA:  Pregnant patient with pain. EXAM: OBSTETRIC <14 WK US AND TRANSVAGINAL OB US TECHNIQUE: Both transabdominal and transvaginal ultrasound examinations were performed for complete evaluation of the gestation as well as the maternal  uterus, adnexal regions, and pelvic cul-de-sac. Transvaginal technique was performed to assess early pregnancy. COMPARISON:  None. FINDINGS: Intrauterine gestational sac: Single Yolk sac:  Visualized. Embryo:  Visualized. Cardiac Activity: Visualized. Heart Rate: 108 bpm MSD:   mm    w     d CRL:  5 mm   6 w   1 d                  US EDC: December 28, 2018 Subchorionic hemorrhage:  There is a small subchorionic hemorrhage. Maternal uterus/adnexae: The ovaries are normal in appearance. A small amount of free fluid in the pelvis is likely physiologic. IMPRESSION: 1. Single live IUP.  Small subchorionic hemorrhage. Electronically Signed   By: Gerome Samavid  Williams III M.D   On: 05/05/2018 11:18   [place] Ob Transvaginal  Result Date: 05/05/2018 CLINICAL DATA:  Pregnant patient with pain. EXAM: OBSTETRIC <14 WK [place] AND TRANSVAGINAL OB US TECHNIQUE: Both transabdominal and transvaginal ultrasound examinations were performed for complete evaluation of the gestation as well as the maternal uterus, adnexal regions, and pelvic cul-de-sac. Transvaginal technique was performed to assess early pregnancy. COMPARISON:  None. FINDINGS: Intrauterine gestational sac: Single Yolk sac:  Visualized. Embryo:  Visualized. Cardiac Activity: Visualized. Heart Rate: 108 bpm MSD:   mm    w     d CRL:  5 mm   6 w   1 d                  [place] EDC: December 28, 2018 Subchorionic hemorrhage:  There is a small subchorionic hemorrhage. Maternal uterus/adnexae: The ovaries are normal in appearance. A small amount of free fluid in the pelvis is likely physiologic. IMPRESSION: 1. Single live IUP.  Small subchorionic hemorrhage. Electronically Signed   By: Gerome Sam III M.D   On: 05/05/2018 11:18    Procedures Procedures (including critical care time)  Medications Ordered in ED Medications  sodium chloride 0.9 % bolus 1,000 mL (0 mLs Intravenous Stopped 05/05/18 0947)  ondansetron (ZOFRAN) injection 4 mg (4 mg Intravenous Given 05/05/18 0831)      Initial Impression / Assessment and Plan / ED Course  I have reviewed the triage vital signs and the nursing notes.  Pertinent labs & imaging results that were available during my care of the patient were reviewed by me and considered in my medical decision making (see chart for details).     Carol Pitts is a 27 y.o. female with a past medical history significant for prior miscarriage and currently several weeks pregnant who presents with nausea and vomiting.  She reports that he has had nausea and vomiting with early pregnancies multiple times.  She reports that she called an OB/GYN who called him likely just for her.  She reports that she is taking it for several days without relief.  She reports she is feeling dehydrated because she is not giving any fluid down and is dry heaving 4 days.  She denies constipation or diarrhea.  She denies any urinary symptoms.  She reports some abdominal cramping which she reports is related to her vomiting.  She denies lower abdominal pain.  She denies vaginal discharge or vaginal bleeding.  She has not seen an OB/GYN for this pregnancy.  Next  She reports that several months ago she had a miscarriage but has no history of ectopic pregnancy.  She denies any trauma, other medications, or any other complaints.  On exam, abdomen is nontender.  Lungs are clear.  Chest is nontender.  Patient appears well but nauseous.  Next  Given her reported pregnancy, a quantitative hCG was ordered.  Patient also had screen laboratory testing to look for elective ab body or severe dehydration with her constant vomiting.  Patient was given fluids.  A long shared decision made, patient was held as to what nausea medicine to administer.  She reports that she has taken Zofran in the past with success.  We discussed how there is a nonzero risk for congenital abnormalities including cardiac abnormalities in the fetus as well as possible contribution to miscarriage.  We agreed  that the patient getting dehydrated and having acute kidney injury would also not be good for a developing fetus.  After our discussion, patient feels comfortable using the medicine she has used in pregnancy before, the Zofran, which is ordered.  Next  Patient reported complete resolution of nausea after the Zofran.  Due to the patient's cramping, and to rule out ectopic pregnancy, a ultrasound was ordered.  Ultrasound showed a single intrauterine pregnancy with some subchorionic hemorrhage.  Heart rate was 1082 for the fetus.  Next  Patient was informed of the findings as well as the hemorrhage seen.  She was informed that this can be associated with miscarriage.  Patient will follow-up with her OB/GYN for further management of her early pregnancy as well as her nausea and vomiting.  Patient was symptom-free and was feeling much better after fluids.  Patient requested discharge home.    Another shared decision conversation was held about the Zofran.  Patient understands the risks of taking the medication as well as contributing to miscarriage.  Patient given prescription for several dose of Zofran to be used sparingly for severe dehydration and vomiting.  Patient will follow-up as we discussed with OB/GYN and return precautions were given.    Patient understood return precautions and patient was discharged in good condition.       Final Clinical Impressions(s) / ED Diagnoses   Final diagnoses:  Dehydration  Non-intractable vomiting with nausea, unspecified vomiting type  Less than [redacted] weeks gestation of pregnancy    ED Discharge Orders        Ordered    ondansetron (ZOFRAN) 4 MG tablet  Every 8 hours PRN     05/05/18 1406     Clinical Impression: 1. Dehydration   2. Non-intractable vomiting with nausea, unspecified vomiting type   3. Less than [redacted] weeks gestation of pregnancy     Disposition: Discharge  Condition: Good  I have discussed the results, Dx and Tx plan with the pt(&  family if present). He/she/they expressed understanding and agree(s) with the plan. Discharge instructions discussed at great length. Strict return precautions discussed and pt &/or family have verbalized understanding of the instructions. No further questions at time of discharge.    Discharge Medication List as of 05/05/2018  2:07 PM    START taking these medications   Details  ondansetron (ZOFRAN) 4 MG tablet Take 1 tablet (4 mg total) by mouth every 8 (eight) hours as needed., Starting Sun 05/05/2018, Print        Follow Up: [facility] [address] [place] [address] [phone] Schedule an appointment as soon as possible for a visit    [facility] [place] [facility]MENT [address] [id number] [address] [place] [address] [phone]   \    Jazion Atteberry, Canary Brim, MD 05/05/18 [phone]

## 2018-05-05 NOTE — Discharge Instructions (Signed)
Your work-up today showed an intrauterine pregnancy at 6 weeks.  As we discussed, there is also the evidence of subchorionic hemorrhage.  We suspect you were dehydrated after vomiting.  Please be careful to maintain hydration and follow-up with your OB/GYN team.  Our shared decision making conversation led to Carol Pitts both ordering and prescribing the Zofran nausea medicine.  Please use it sparingly and at the direction of your OB/GYN.  If any symptoms change or worsen, please return to the nearest emergency department.

## 2018-05-06 LAB — URINE CULTURE

## 2018-08-13 IMAGING — CT CT ABD-PELV W/ CM
2 of 4 series · 16 of 46 positions shown, 18 images · IV contrast (iopamidol)
Comparison: None.

CLINICAL DATA: 26-year-old female with 4 day history of right lower
quadrant pain

EXAM:
CT ABDOMEN AND PELVIS WITH CONTRAST
TECHNIQUE: Multidetector CT imaging of the abdomen and pelvis was performed
using the standard protocol following bolus administration of
intravenous contrast.
CONTRAST:  100mL ALBZT4-GUU IOPAMIDOL (ALBZT4-GUU) INJECTION 61%

[Series 2: axial st · axial · 0.79mm/px · z∈[+758,+1178]mm · 13 of 92 slices shown, 15 images]
[im 4/92  soft-tissue]
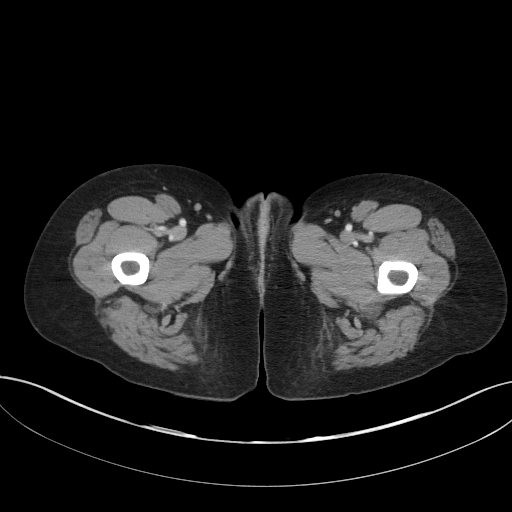
[im 4/92  bone]
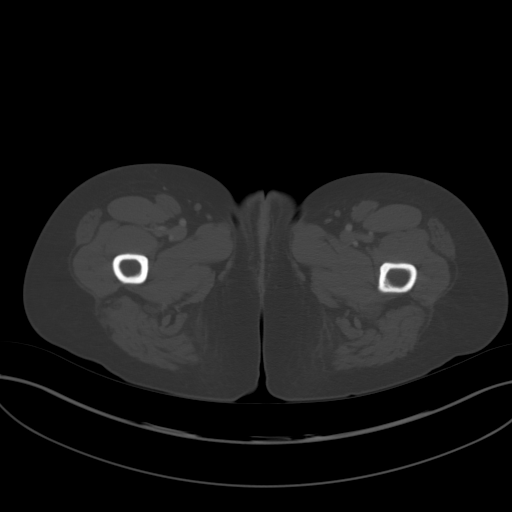
[im 12/92  soft-tissue]
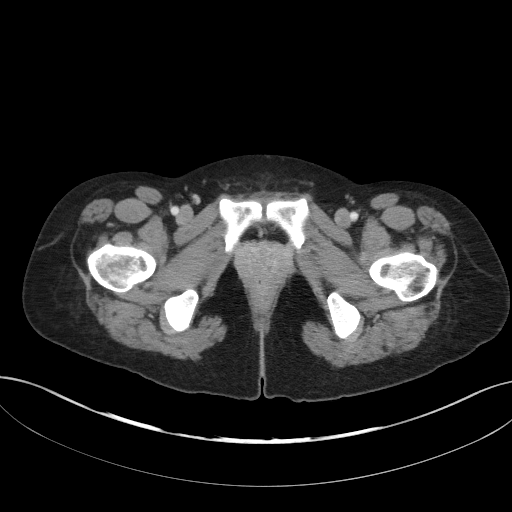
[im 19/92  soft-tissue]
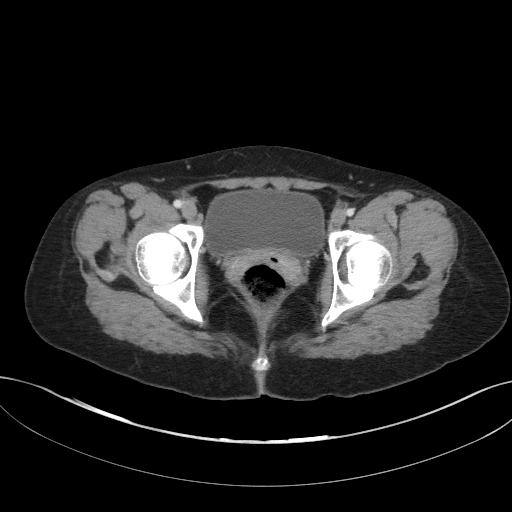
[im 27/92  soft-tissue]
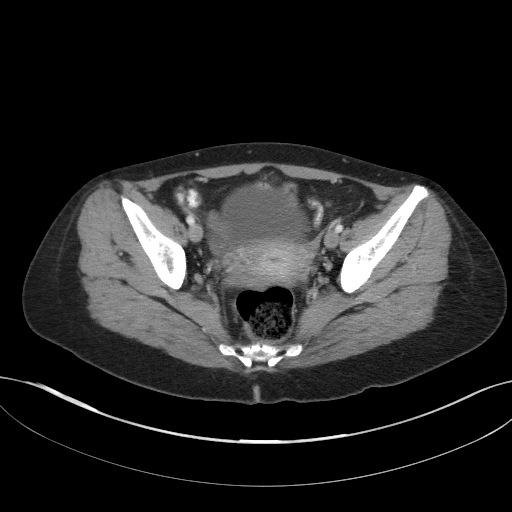
[im 31/92  soft-tissue]
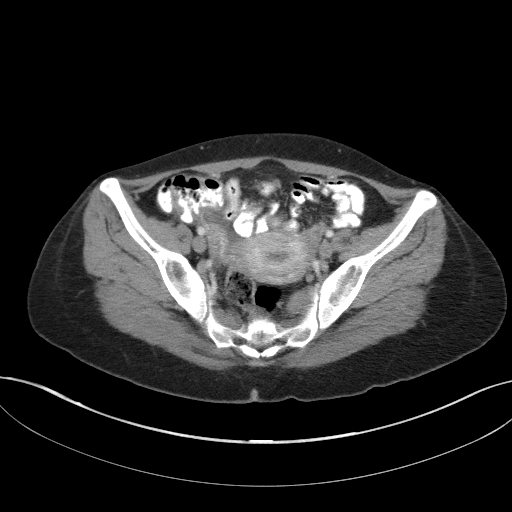
[im 38/92  soft-tissue]
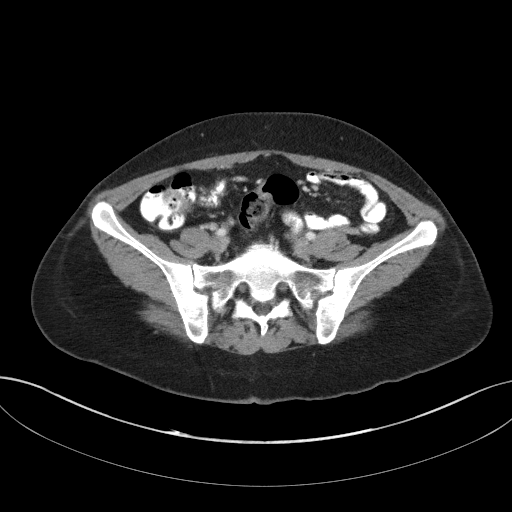
[im 46/92  soft-tissue]
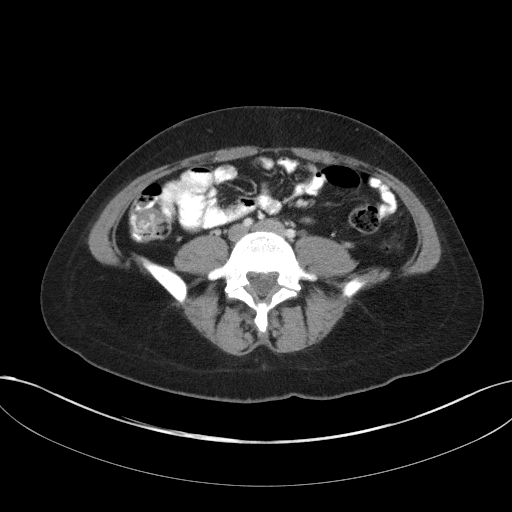
[im 54/92  soft-tissue]
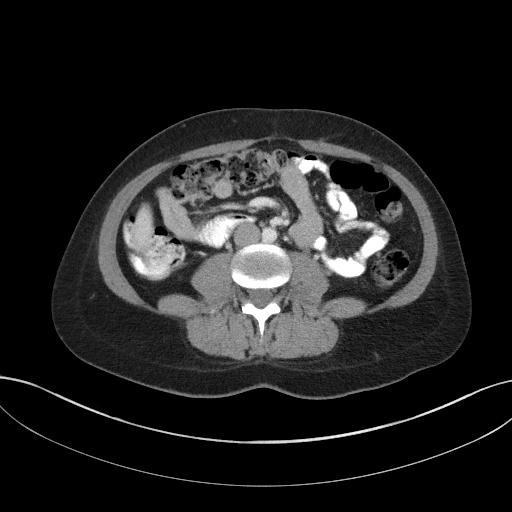
[im 61/92  soft-tissue]
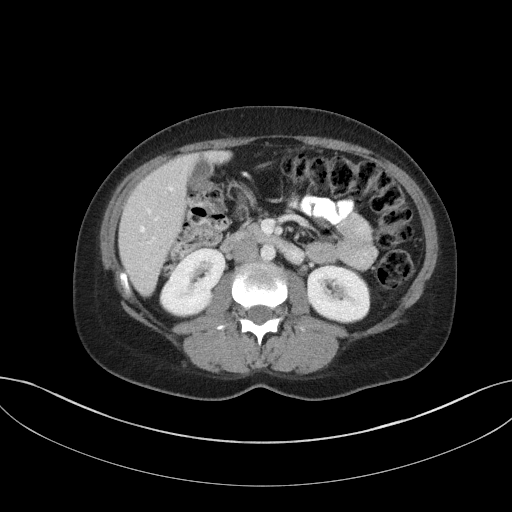
[im 61/92  bone]
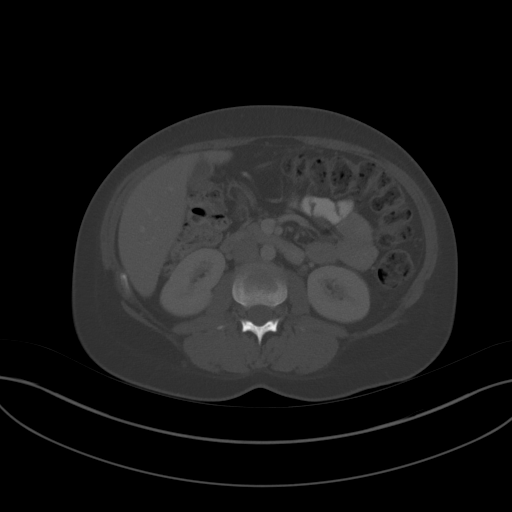
[im 65/92  soft-tissue]
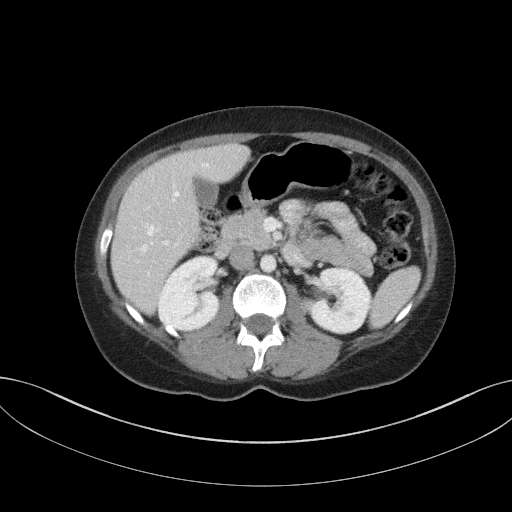
[im 73/92  soft-tissue]
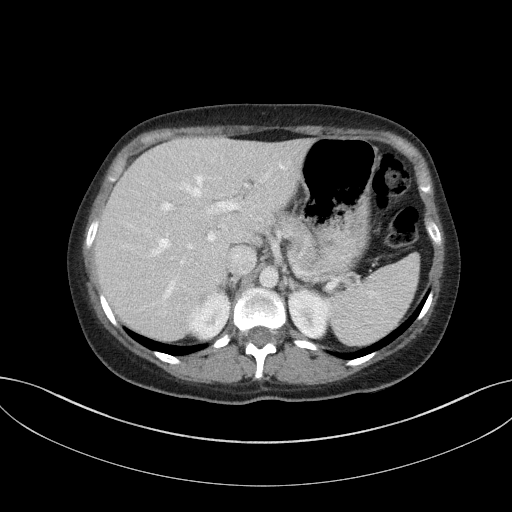
[im 80/92  soft-tissue]
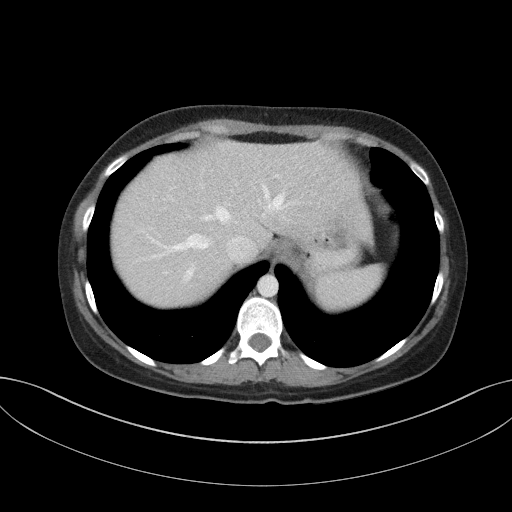
[im 88/92  soft-tissue]
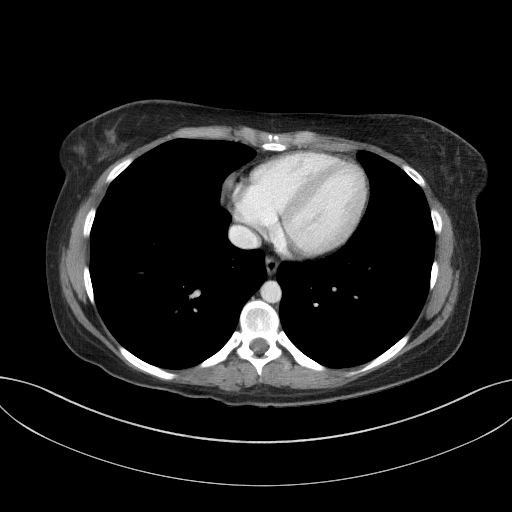

[Series 5: coronal st · coronal · 0.81mm/px · 3 of 77 slices shown]
[im 26/77  soft-tissue]
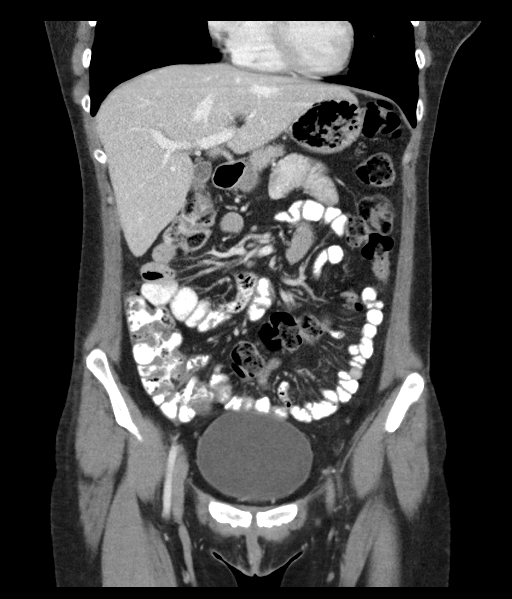
[im 34/77  soft-tissue]
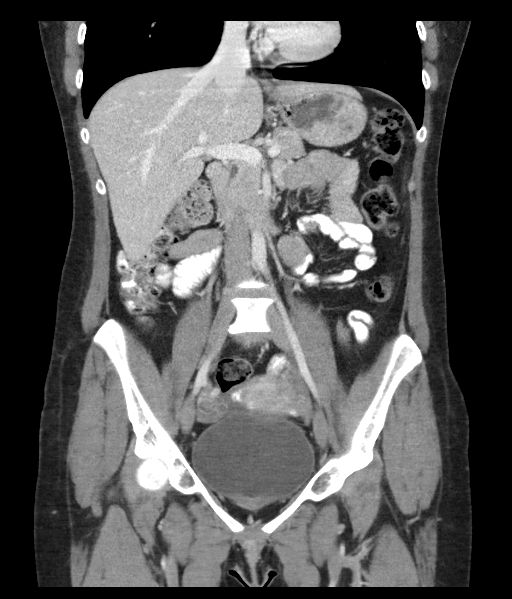
[im 43/77  soft-tissue]
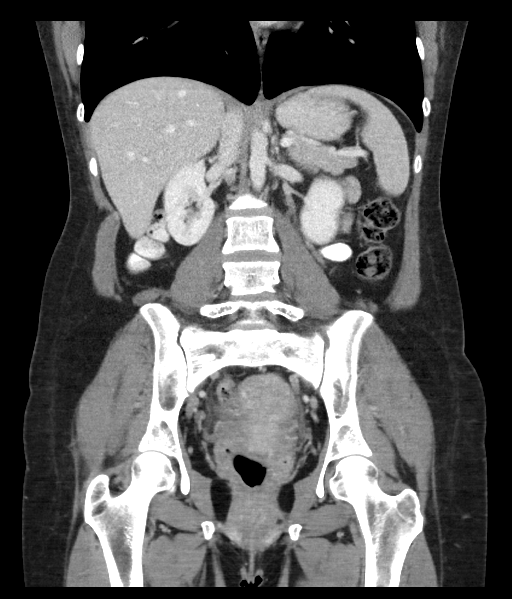

[16 of 46 positions shown; findings below may reference images not displayed]

FINDINGS: Lower chest: The lung bases are clear. Visualized cardiac structures
are within normal limits for size. No pericardial effusion.
Unremarkable visualized distal thoracic esophagus.

Hepatobiliary: Normal hepatic contour and morphology. No discrete
hepatic lesions. Normal appearance of the gallbladder. No intra or
extrahepatic biliary ductal dilatation.

Pancreas: Unremarkable. No pancreatic ductal dilatation or
surrounding inflammatory changes.

Spleen: Normal in size without focal abnormality.

Adrenals/Urinary Tract: Normal adrenal glands. The kidneys perfuse
symmetrically bilaterally. No enhancing renal mass or
hydronephrosis. 3 mm stone in the interpolar left kidney.
Unremarkable ureters and bladder.

Stomach/Bowel: No evidence of obstruction or focal bowel wall
thickening. Normal appendix in the right lower quadrant. The
terminal ileum is unremarkable.

Vascular/Lymphatic: No significant vascular findings are present. No
enlarged abdominal or pelvic lymph nodes.

Reproductive: Uterus and bilateral adnexa are unremarkable. Small
1.8 cm follicular cyst in the right ovary is considered a normal
finding in a reproductive age female.

Other: No abdominal wall hernia or abnormality. No abdominopelvic
ascites.

Musculoskeletal: No acute or significant osseous findings. Chronic
bilateral L5 pars defects.
IMPRESSION: 1. No acute abnormality within the abdomen or pelvis to explain the
patient's clinical symptoms.
2. Incidental note is made of a 3 mm nonobstructing stone in the
left kidney.
3. Chronic bilateral L5 pars defects. No significant
anterolisthesis.

## 2018-09-10 ENCOUNTER — Other Ambulatory Visit: Payer: Self-pay

## 2018-09-10 ENCOUNTER — Encounter (HOSPITAL_BASED_OUTPATIENT_CLINIC_OR_DEPARTMENT_OTHER): Payer: Self-pay | Admitting: *Deleted

## 2018-09-10 ENCOUNTER — Emergency Department (HOSPITAL_BASED_OUTPATIENT_CLINIC_OR_DEPARTMENT_OTHER)
Admission: EM | Admit: 2018-09-10 | Discharge: 2018-09-10 | Disposition: A | Discharge status: Home / Self Care | Payer: Medicaid Other | Attending: Emergency Medicine | Admitting: Emergency Medicine

## 2018-09-10 DIAGNOSIS — Z79899 Other long term (current) drug therapy: Secondary | ICD-10-CM | POA: Diagnosis not present

## 2018-09-10 DIAGNOSIS — Z3A24 24 weeks gestation of pregnancy: Secondary | ICD-10-CM | POA: Insufficient documentation

## 2018-09-10 DIAGNOSIS — Z87891 Personal history of nicotine dependence: Secondary | ICD-10-CM | POA: Insufficient documentation

## 2018-09-10 DIAGNOSIS — O208 Other hemorrhage in early pregnancy: Secondary | ICD-10-CM | POA: Insufficient documentation

## 2018-09-10 DIAGNOSIS — O4690 Antepartum hemorrhage, unspecified, unspecified trimester: Secondary | ICD-10-CM

## 2018-09-10 LAB — URINALYSIS, ROUTINE W REFLEX MICROSCOPIC
Bilirubin Urine: NEGATIVE
GLUCOSE, UA: NEGATIVE mg/dL
KETONES UR: NEGATIVE mg/dL
NITRITE: NEGATIVE
PH: 7.5 (ref 5.0–8.0)
PROTEIN: NEGATIVE mg/dL
Specific Gravity, Urine: 1.01 (ref 1.005–1.030)

## 2018-09-10 LAB — URINALYSIS, MICROSCOPIC (REFLEX)

## 2018-09-10 NOTE — ED Provider Notes (Signed)
[facility] EMERGENCY DEPARTMENT Provider Note   CSN: [id number] Arrival date & time: 09/10/18  1056     History   Chief Complaint Chief Complaint  Patient presents with  . Vaginal Bleeding  . [redacted] Weeks Pregnant    HPI Carol Pitts is a 27 y.o. female.  27 yo F with a chief complaint of spotting.  The patient is [redacted] weeks pregnant.  She is a gravida 4 para 2, had a spontaneous miscarriage at 11 weeks with her last pregnancy.  She has had no prior issues with her pregnancies denies gestational diabetes or eclampsia.  She denies sex last night or this morning.  She denies any foreign body in her vagina.  She has had some very mild cramping.  Vaginal bleeding is much less than a normal menstrual cycle.  Denies overt abdominal tenderness.  She called her OB/GYN who made an appointment for this afternoon but she did not feel she could wait that long and so came to the ED.  The history is provided by the patient.  Vaginal Bleeding  Primary symptoms include pelvic pain, vaginal bleeding.  Primary symptoms include no dysuria. There has been no fever. This is a new problem. The current episode started 1 to 2 hours ago. The problem occurs constantly. The problem has not changed since onset.She is pregnant. She has missed her period. Her LMP was months ago. The discharge was normal. Pertinent negatives include no nausea, no vomiting and no dizziness. She has tried nothing for the symptoms. The treatment provided no relief. Sexual activity: sexually active. There is no concern regarding sexually transmitted diseases.    History reviewed. No pertinent past medical history.  There are no active problems to display for this patient.   History reviewed. No pertinent surgical history.   OB History    Gravida  4   Para      Term      Preterm      AB  1   Living        SAB      TAB      Ectopic      Multiple      Live Births  2            Home Medications     Prior to Admission medications   Medication Sig Start Date End Date Taking? Authorizing Provider  Prenatal Vit-Fe Fumarate-FA (PRENATAL COMPLETE) 14-0.4 MG TABS Take 1 tablet by mouth daily. 01/29/18  Yes Dartha Lodge, PA-C  Sertraline HCl (ZOLOFT PO) Take by mouth.   Yes [provider]  benzonatate (TESSALON) 100 MG capsule Take 1 capsule (100 mg total) by mouth every 8 (eight) hours. 08/14/17   Kirichenko, Lemont Fillers, PA-C  dextromethorphan-guaiFENesin (MUCINEX DM) 30-600 MG 12hr tablet Take 1 tablet by mouth 2 (two) times daily.    [provider]  Doxylamine-Pyridoxine (DICLEGIS PO) Take by mouth.    [provider]  guaiFENesin (ROBITUSSIN) 100 MG/5ML liquid Take 5-10 mLs (100-200 mg total) by mouth every 4 (four) hours as needed for cough. 09/26/15   Barrett, Rolm Gala, PA-C  metroNIDAZOLE (FLAGYL) 500 MG tablet Take 1 tablet (500 mg total) by mouth 2 (two) times daily. 08/14/17   Kirichenko, Lemont Fillers, PA-C  ondansetron (ZOFRAN ODT) 4 MG disintegrating tablet 4mg  ODT q4 hours prn nausea/vomit 01/29/18   Dartha Lodge, PA-C  ondansetron (ZOFRAN) 4 MG tablet Take 1 tablet (4 mg total) by mouth every 8 (eight) hours as  needed. 05/05/18   Tegeler, Canary Brim, MD  pseudoephedrine (SUDAFED) 30 MG tablet Take 1 tablet (30 mg total) by mouth every 4 (four) hours as needed for congestion. 08/14/17   Jaynie Crumble, PA-C    Family History No family history on file.  Social History Social History   Tobacco Use  . Smoking status: Former Smoker    Packs/day: 0.50    Types: Cigarettes  . Smokeless tobacco: Never Used  Substance Use Topics  . Alcohol use: No  . Drug use: Never     Allergies   Patient has no known allergies.   Review of Systems Review of Systems  Constitutional: Negative for chills and fever.  HENT: Negative for congestion and rhinorrhea.   Eyes: Negative for redness and visual disturbance.  Respiratory: Negative for shortness of breath and  wheezing.   Cardiovascular: Negative for chest pain and palpitations.  Gastrointestinal: Negative for nausea and vomiting.  Genitourinary: Positive for pelvic pain and vaginal bleeding. Negative for dysuria and urgency.  Musculoskeletal: Negative for arthralgias and myalgias.  Skin: Negative for pallor and wound.  Neurological: Negative for dizziness and headaches.     Physical Exam Updated Vital Signs BP 98/62 (BP Location: Left Arm)   Pulse 70   Temp 98.5 F (36.9 C) (Oral)   Resp 16   Ht 5\' 2"  (1.575 m)   Wt 68 kg   LMP 03/17/2018   SpO2 100%   BMI 27.44 kg/m   Physical Exam  Constitutional: She is oriented to person, place, and time. She appears well-developed and well-nourished. No distress.  HENT:  Head: Normocephalic and atraumatic.  Eyes: Pupils are equal, round, and reactive to light. EOM are normal.  Neck: Normal range of motion. Neck supple.  Cardiovascular: Normal rate and regular rhythm. Exam reveals no gallop and no friction rub.  No murmur heard. Pulmonary/Chest: Effort normal. She has no wheezes. She has no rales.  Abdominal: Soft. She exhibits no distension and no mass. There is no tenderness. There is no guarding.  Gravid   Musculoskeletal: She exhibits no edema or tenderness.  Neurological: She is alert and oriented to person, place, and time.  Skin: Skin is warm and dry. She is not diaphoretic.  Psychiatric: She has a normal mood and affect. Her behavior is normal.  Nursing note and vitals reviewed.    ED Treatments / Results  Labs (all labs ordered are listed, but only abnormal results are displayed) Labs Reviewed  URINALYSIS, ROUTINE W REFLEX MICROSCOPIC - Abnormal; Notable for the following components:      Result Value   Hgb urine dipstick MODERATE (*)    Leukocytes, UA MODERATE (*)    All other components within normal limits  URINALYSIS, MICROSCOPIC (REFLEX) - Abnormal; Notable for the following components:   Bacteria, UA FEW (*)    All  other components within normal limits    EKG None  Radiology No results found.  Procedures Procedures (including critical care time)  Medications Ordered in ED Medications - No data to display   Initial Impression / Assessment and Plan / ED Course  I have reviewed the triage vital signs and the nursing notes.  Pertinent labs & imaging results that were available during my care of the patient were reviewed by me and considered in my medical decision making (see chart for details).     27 yo F with a chief complaint of vaginal spotting.  Patient without significant discomfort.  Bleeding less than normal menstrual cycle.  Well-appearing and nontoxic.  Rapid response to be contacted per protocol.  Rapid response OB feels reassuring fetal heart tracing.  Recommends outpatient follow up.   2:46 PM:  I have discussed the diagnosis/risks/treatment options with the patient and believe the pt to be eligible for discharge home to follow-up with OB. We also discussed returning to the ED immediately if new or worsening sx occur. We discussed the sx which are most concerning (e.g., sudden worsening pain, worsening bleeding) that necessitate immediate return. Medications administered to the patient during their visit and any new prescriptions provided to the patient are listed below.  Medications given during this visit Medications - No data to display    The patient appears reasonably screen and/or stabilized for discharge and I doubt any other medical condition or other [facility] requiring further screening, evaluation, or treatment in the ED at this time prior to discharge.    Final Clinical Impressions(s) / ED Diagnoses   Final diagnoses:  Vaginal bleeding in pregnancy    ED Discharge Orders    None       Melene Plan, DO 09/10/18 1447

## 2018-09-10 NOTE — ED Notes (Signed)
Erin from rapid response at [employer] notified

## 2018-09-10 NOTE — ED Triage Notes (Addendum)
[redacted] weeks pregnant with vaginal bleeding like a light period that started this am. She had a normal [place] yesterday by her OBGYN. She was started on Zoloft yesterday and took one pill last night.

## 2018-09-10 NOTE — Progress Notes (Signed)
1126 Received call regarding this 27 yo G4P0 at 24 wks in with report of vaginal bleeding/ spotting. Denies recent vaginal exam or intercourse. Denies contractions and reports good fetal movement. 1135 ED staff notified that [facility] not recording tracing.  Suggestions made for identifying the problem. Unable to resolve.  They will put in a ticket with [id number] Requested EFM tracing be faxed to labor and delivery for review. 1251 EFM tracing Category I, no UCs recorded. ED staff notified that pt is OB cleared.

## 2018-09-10 NOTE — ED Notes (Signed)
Tracing from monitor faxed to [facility] for review.

## 2018-09-10 NOTE — ED Notes (Signed)
Erin from rapid response called and advised pt is clear from an OB stand point and should follow up with her doctor. EDP made aware.

## 2019-03-07 IMAGING — US US OB TRANSVAGINAL
1 series · 13 of 18 positions shown · non-contrast
Comparison: None.

CLINICAL DATA: Initial evaluation for acute right lower quadrant
pain. Early pregnancy.

EXAM:
OBSTETRIC <14 WK US AND TRANSVAGINAL OB US
TECHNIQUE: Both transabdominal and transvaginal ultrasound examinations were
performed for complete evaluation of the gestation as well as the
maternal uterus, adnexal regions, and pelvic cul-de-sac.
Transvaginal technique was performed to assess early pregnancy.

[Series 1: us ob transvaginal · 0.10mm/px · 13 of 18 slices shown]
[im 1/18]
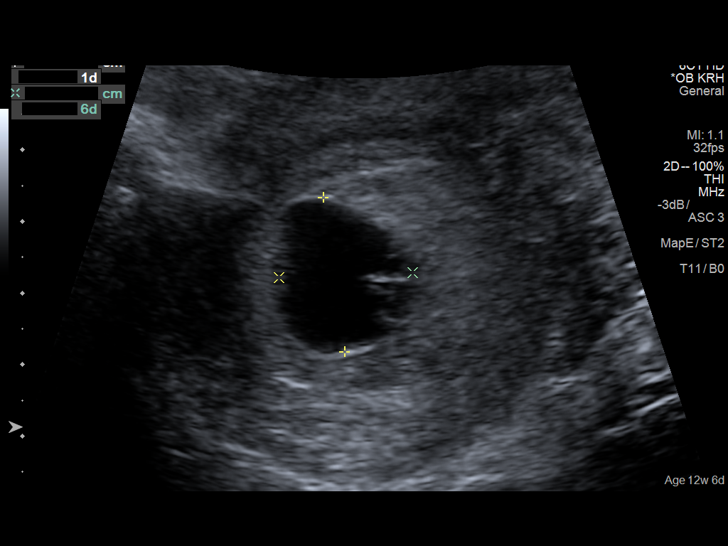
[im 3/18]
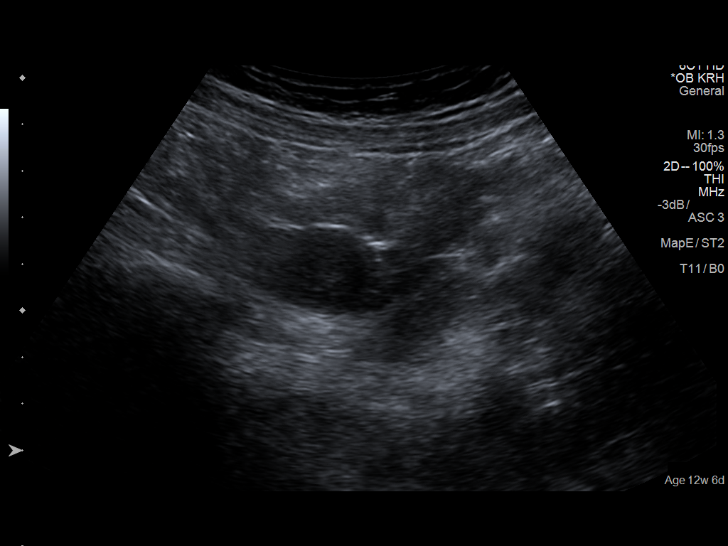
[im 4/18]
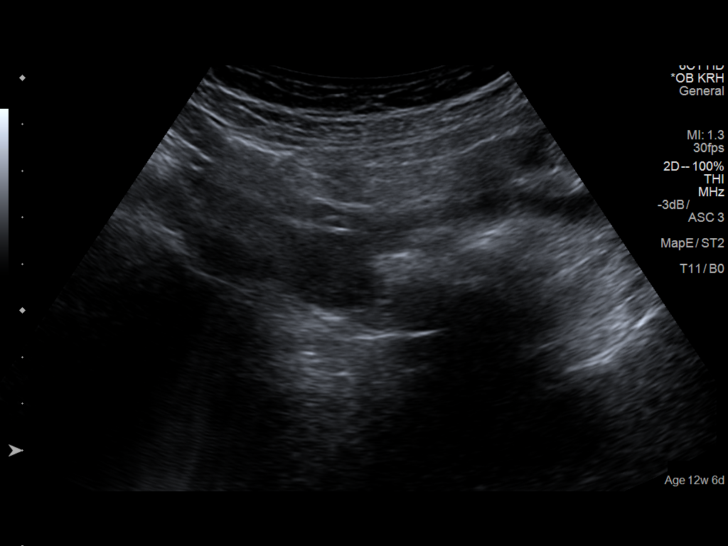
[im 5/18]
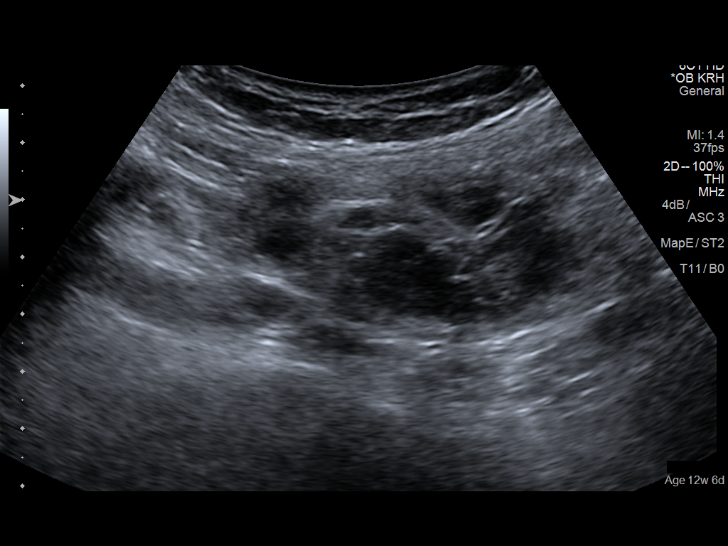
[im 7/18]
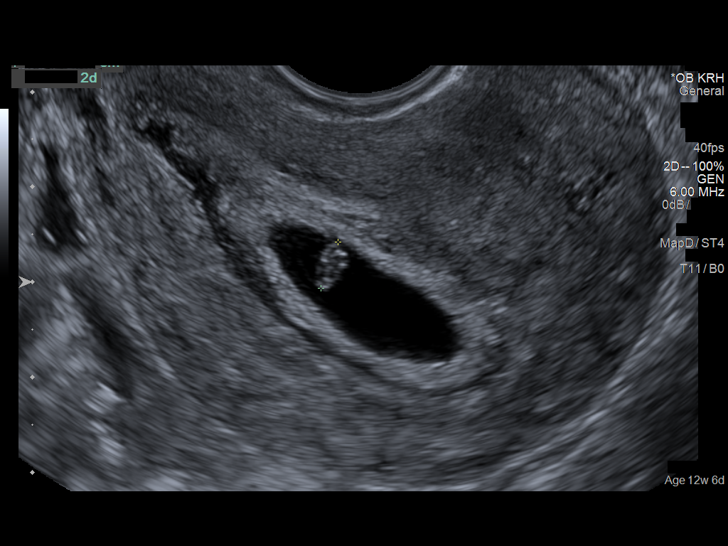
[im 8/18]
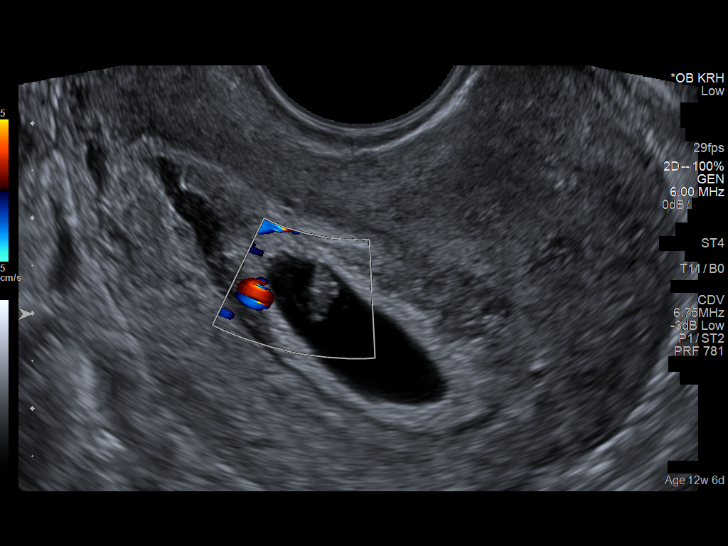
[im 10/18]
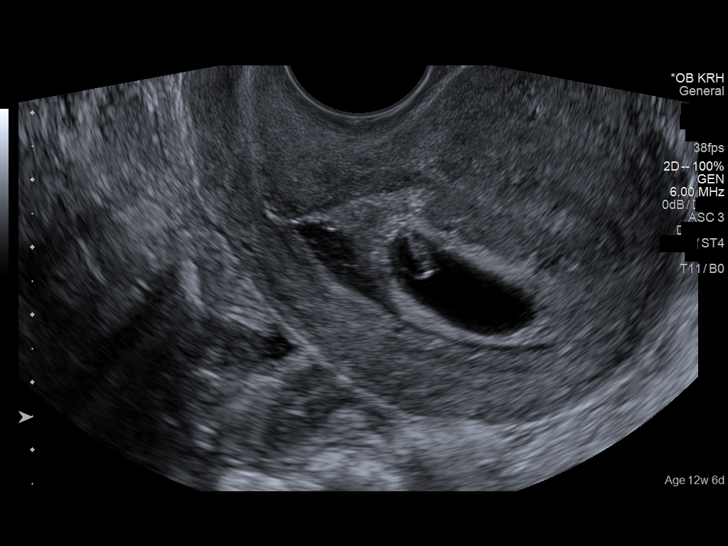
[im 11/18]
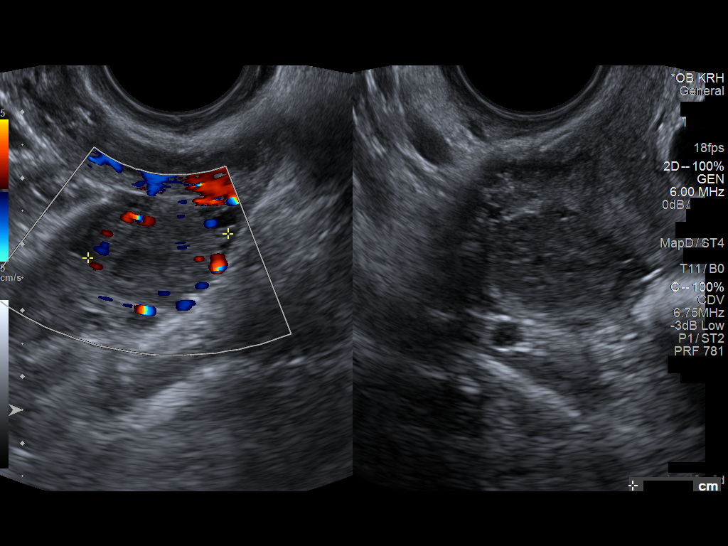
[im 12/18]
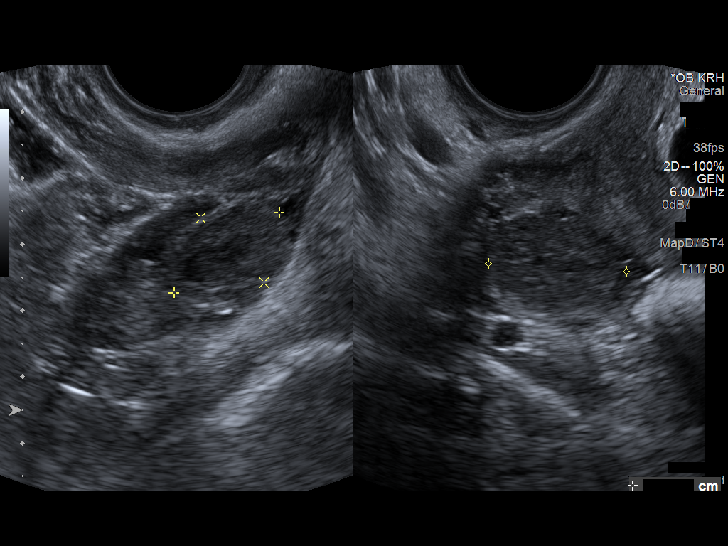
[im 14/18]
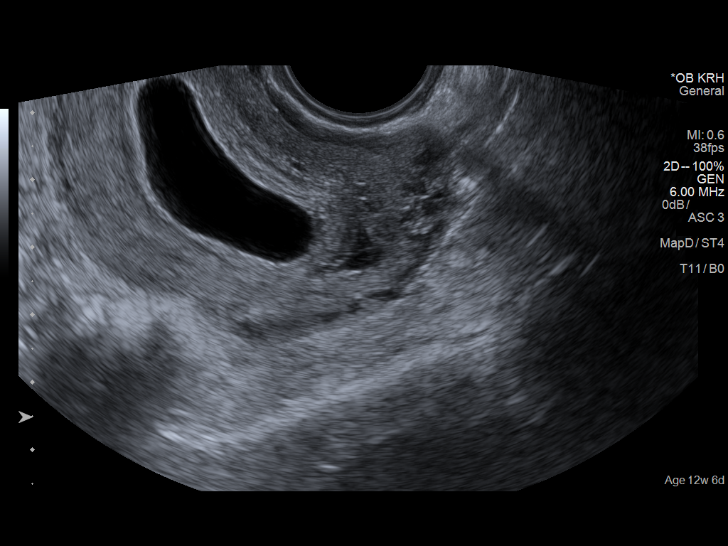
[im 15/18]
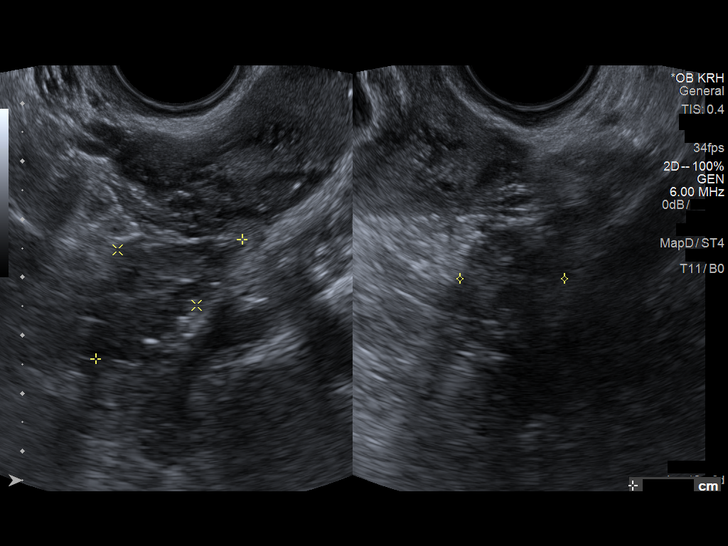
[im 16/18]
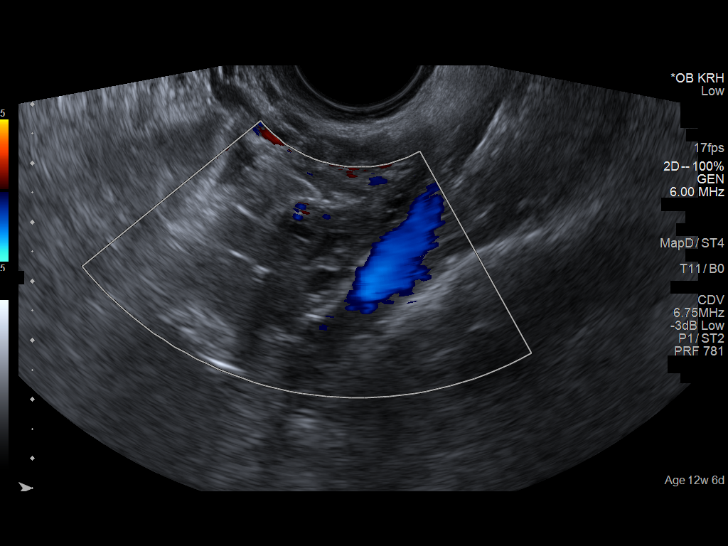
[im 18/18]
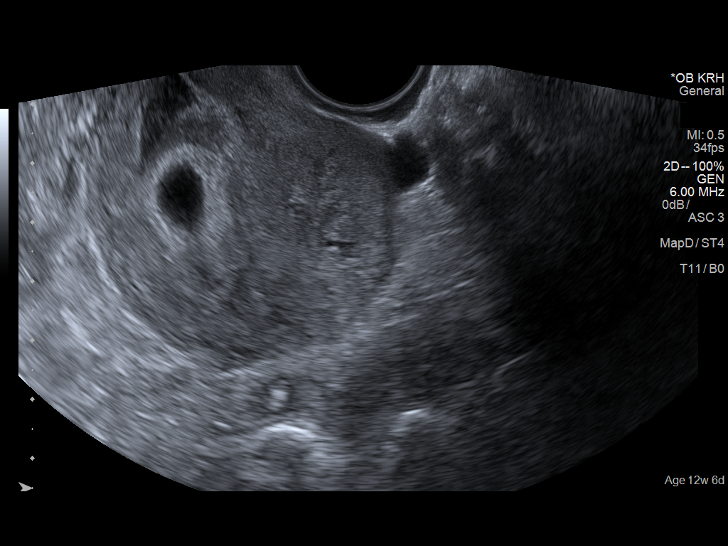

[13 of 18 positions shown; findings below may reference images not displayed]

FINDINGS: Intrauterine gestational sac: Single

Yolk sac:  Present

Embryo:  Present

Cardiac Activity: Not visualized.

Heart Rate: N/A  bpm

CRL:  5.0 mm   6 w   2 d                  US EDC: 09/18/2018

Subchorionic hemorrhage: Small to moderate subchorionic hemorrhage
without significant mass effect.

Maternal uterus/adnexae: Left ovary is normal in appearance. Small
corpus luteal cyst noted within the right ovary. Small volume free
fluid present within the pelvis.
IMPRESSION: 1. Single intrauterine gestational sac containing a yolk sac and
embryo, but no cardiac activity visualized. Crown-rump length equals
5 mm. Findings are suspicious but not yet definitive for failed
pregnancy. Recommend follow-up US in 10-14 days for definitive
diagnosis. This recommendation follows SRU consensus guidelines:
Diagnostic Criteria for Nonviable Pregnancy Early in the First
Trimester. N Engl J Med 0883; [DATE].
2. Small to moderate subchorionic hemorrhage without associated mass
effect.
3. No other acute maternal uterine or adnexal abnormality
identified.

## 2019-06-07 HISTORY — PX: TUBAL LIGATION: SHX77

## 2021-08-17 ENCOUNTER — Encounter (HOSPITAL_COMMUNITY): Payer: Self-pay | Admitting: Oral Surgery

## 2021-08-17 NOTE — H&P (Signed)
  Patient: Carol Pitts  PID: [id number]  DOB: Jan 01, 1991  SEX: Female   Patient referred by general dentist for extraction multiple teeth  CC: On off pain x 6 months   Past Medical History:  Smoker, Bruise Easily    Medications: None    Allergies:     NKDA    Surgeries:   Oral Surgery     Social History       Smoking: <1 ppd           Alcohol:n Drug use: denies                             Exam: BMI 23. Multiple caries # 2, 3, 4, 6, 7, 8, 9, 10, 11, 13, 14, 15, 31.  No purulence, edema, fluctuance, trismus. Oral cancer screening negative. Pharynx clear. No lymphadenopathy.  Panorex: Multiple caries # 2, 3, 4, 6, 7, 8, 9, 10, 11, 13, 14, 15, 31.   Assessment: ASA 2. Non-restorable               Plan: Extraction Teeth #2, 3, 4, 6, 7, 8, 9, 10, 11, 13, 14, 15, 31. Alveoloplasty. Hospital Day surgery.                 Rx: n               Risks and complications explained. Questions answered.   Georgia Lopes, DMD

## 2021-08-17 NOTE — Anesthesia Preprocedure Evaluation (Addendum)
Anesthesia Evaluation  Patient identified by MRN, date of birth, ID band Patient awake    Reviewed: Allergy & Precautions, NPO status , Patient's Chart, lab work & pertinent test results  History of Anesthesia Complications Negative for: history of anesthetic complications  Airway Mallampati: II  TM Distance: >3 FB Neck ROM: Full    Dental  (+) Poor Dentition, Missing   Pulmonary Current Smoker,    Pulmonary exam normal        Cardiovascular negative cardio ROS Normal cardiovascular exam     Neuro/Psych Depression negative neurological ROS     GI/Hepatic negative GI ROS, Neg liver ROS,   Endo/Other  negative endocrine ROS  Renal/GU negative Renal ROS  negative genitourinary   Musculoskeletal negative musculoskeletal ROS (+)   Abdominal   Peds  Hematology negative hematology ROS (+)   Anesthesia Other Findings Day of surgery medications reviewed with patient.  Reproductive/Obstetrics negative OB ROS                            Anesthesia Physical Anesthesia Plan  ASA: 2  Anesthesia Plan: General   Post-op Pain Management:    Induction: Intravenous  PONV Risk Score and Plan: 3 and Treatment may vary due to age or medical condition, Ondansetron, Dexamethasone, Midazolam and Scopolamine patch - Pre-op  Airway Management Planned: Nasal ETT  Additional Equipment: None  Intra-op Plan:   Post-operative Plan: Extubation in OR  Informed Consent: I have reviewed the patients History and Physical, chart, labs and discussed the procedure including the risks, benefits and alternatives for the proposed anesthesia with the patient or authorized representative who has indicated his/her understanding and acceptance.     Dental advisory given  Plan Discussed with: CRNA  Anesthesia Plan Comments:        Anesthesia Quick Evaluation

## 2021-08-17 NOTE — Progress Notes (Signed)
DUE TO COVID-19 ONLY ONE VISITOR IS ALLOWED TO COME WITH YOU AND STAY IN THE WAITING ROOM ONLY DURING PRE OP AND PROCEDURE DAY OF SURGERY.   PCP - none Cardiologist - n/a  Chest x-ray - n/a EKG - n/a Stress Test - n/a ECHO - n/a Cardiac Cath - n/a  ICD Pacemaker/Loop - n/a  Sleep Study -  n/a CPAP - none  STOP now taking any Aspirin (unless otherwise instructed by your surgeon), Aleve, Naproxen, Ibuprofen, Motrin, Advil, Goody's, BC's, all herbal medications, fish oil, and all vitamins.   Coronavirus Screening Covid test n/a Ambulatory Surgery  Do you have any of the following symptoms:  Cough yes/no: No Fever (>100.65F)  yes/no: No Runny nose yes/no: No Sore throat yes/no: No Difficulty breathing/shortness of breath  yes/no: No  Have you traveled in the last 14 days and where? yes/no: No  Patient verbalized understanding of instructions that were given via phone.

## 2021-08-18 ENCOUNTER — Ambulatory Visit (HOSPITAL_COMMUNITY): Payer: Medicaid Other | Admitting: Anesthesiology

## 2021-08-18 ENCOUNTER — Encounter (HOSPITAL_COMMUNITY): Payer: Self-pay | Admitting: Oral Surgery

## 2021-08-18 ENCOUNTER — Encounter (HOSPITAL_COMMUNITY)
Admission: RE | Disposition: A | Discharge status: Home / Self Care | Payer: Self-pay | Source: Home / Self Care | Attending: Oral Surgery

## 2021-08-18 ENCOUNTER — Ambulatory Visit (HOSPITAL_COMMUNITY)
Admission: RE | Admit: 2021-08-18 | Discharge: 2021-08-18 | Disposition: A | Discharge status: Home / Self Care | Payer: Medicaid Other | Attending: Oral Surgery | Admitting: Oral Surgery

## 2021-08-18 ENCOUNTER — Other Ambulatory Visit: Payer: Self-pay

## 2021-08-18 DIAGNOSIS — K029 Dental caries, unspecified: Secondary | ICD-10-CM | POA: Insufficient documentation

## 2021-08-18 DIAGNOSIS — F17210 Nicotine dependence, cigarettes, uncomplicated: Secondary | ICD-10-CM | POA: Insufficient documentation

## 2021-08-18 HISTORY — DX: Depression, unspecified: F32.A

## 2021-08-18 HISTORY — PX: TOOTH EXTRACTION: SHX859

## 2021-08-18 HISTORY — PX: ALVEOLOPLASTY: SHX5710

## 2021-08-18 LAB — POCT PREGNANCY, URINE: Preg Test, Ur: NEGATIVE

## 2021-08-18 SURGERY — DENTAL RESTORATION/EXTRACTIONS
Anesthesia: General

## 2021-08-18 MED ORDER — MIDAZOLAM HCL 2 MG/2ML IJ SOLN
INTRAMUSCULAR | Status: DC | PRN
  Administered 2021-08-18: 2 mg via INTRAVENOUS

## 2021-08-18 MED ORDER — CEFAZOLIN SODIUM-DEXTROSE 2-4 GM/100ML-% IV SOLN
2.0000 g | INTRAVENOUS | Status: AC
  Administered 2021-08-18: 2 g via INTRAVENOUS
  Filled 2021-08-18: qty 100

## 2021-08-18 MED ORDER — DEXAMETHASONE SODIUM PHOSPHATE 10 MG/ML IJ SOLN
INTRAMUSCULAR | Status: AC
  Filled 2021-08-18: qty 1

## 2021-08-18 MED ORDER — FENTANYL CITRATE (PF) 250 MCG/5ML IJ SOLN
INTRAMUSCULAR | Status: DC | PRN
  Administered 2021-08-18: 50 ug via INTRAVENOUS

## 2021-08-18 MED ORDER — ONDANSETRON HCL 4 MG/2ML IJ SOLN
INTRAMUSCULAR | Status: DC | PRN
  Administered 2021-08-18: 4 mg via INTRAVENOUS

## 2021-08-18 MED ORDER — FENTANYL CITRATE (PF) 250 MCG/5ML IJ SOLN
INTRAMUSCULAR | Status: AC
  Filled 2021-08-18: qty 5

## 2021-08-18 MED ORDER — SUGAMMADEX SODIUM 200 MG/2ML IV SOLN
INTRAVENOUS | Status: DC | PRN
  Administered 2021-08-18: 200 mg via INTRAVENOUS

## 2021-08-18 MED ORDER — LIDOCAINE-EPINEPHRINE 2 %-1:100000 IJ SOLN
INTRAMUSCULAR | Status: AC
  Filled 2021-08-18: qty 1

## 2021-08-18 MED ORDER — ORAL CARE MOUTH RINSE: 15.0000 mL | Freq: Once | OROMUCOSAL | Status: AC

## 2021-08-18 MED ORDER — PROPOFOL 10 MG/ML IV BOLUS
INTRAVENOUS | Status: DC | PRN
  Administered 2021-08-18: 120 mg via INTRAVENOUS
  Administered 2021-08-18: 20 mg via INTRAVENOUS
  Administered 2021-08-18: 30 mg via INTRAVENOUS

## 2021-08-18 MED ORDER — FENTANYL CITRATE (PF) 100 MCG/2ML IJ SOLN: 25.0000 ug | INTRAMUSCULAR | Status: DC | PRN

## 2021-08-18 MED ORDER — ROCURONIUM BROMIDE 10 MG/ML (PF) SYRINGE
PREFILLED_SYRINGE | INTRAVENOUS | Status: DC | PRN
Start: 2021-08-18 — End: 2021-08-18
  Administered 2021-08-18: 50 mg via INTRAVENOUS

## 2021-08-18 MED ORDER — LIDOCAINE-EPINEPHRINE 2 %-1:100000 IJ SOLN
INTRAMUSCULAR | Status: DC | PRN
  Administered 2021-08-18: 8 mL via INTRADERMAL

## 2021-08-18 MED ORDER — ONDANSETRON HCL 4 MG/2ML IJ SOLN
INTRAMUSCULAR | Status: AC
  Filled 2021-08-18: qty 2

## 2021-08-18 MED ORDER — LACTATED RINGERS IV SOLN: INTRAVENOUS | Status: DC

## 2021-08-18 MED ORDER — OXYCODONE HCL 5 MG PO TABS: 5.0000 mg | ORAL_TABLET | Freq: Once | ORAL | Status: DC | PRN

## 2021-08-18 MED ORDER — AMOXICILLIN 500 MG PO CAPS: 500.0000 mg | ORAL_CAPSULE | Freq: Three times a day (TID) | ORAL | 0 refills | Status: DC

## 2021-08-18 MED ORDER — ROCURONIUM BROMIDE 10 MG/ML (PF) SYRINGE
PREFILLED_SYRINGE | INTRAVENOUS | Status: AC
  Filled 2021-08-18: qty 10

## 2021-08-18 MED ORDER — LIDOCAINE 2% (20 MG/ML) 5 ML SYRINGE
INTRAMUSCULAR | Status: DC | PRN
  Administered 2021-08-18: 60 mg via INTRAVENOUS

## 2021-08-18 MED ORDER — HYDROCODONE-ACETAMINOPHEN 5-325 MG PO TABS: 1.0000 | ORAL_TABLET | Freq: Four times a day (QID) | ORAL | 0 refills | Status: DC | PRN

## 2021-08-18 MED ORDER — OXYCODONE HCL 5 MG/5ML PO SOLN: 5.0000 mg | Freq: Once | ORAL | Status: DC | PRN

## 2021-08-18 MED ORDER — MIDAZOLAM HCL 2 MG/2ML IJ SOLN
INTRAMUSCULAR | Status: AC
  Filled 2021-08-18: qty 2

## 2021-08-18 MED ORDER — PROMETHAZINE HCL 25 MG/ML IJ SOLN: 6.2500 mg | INTRAMUSCULAR | Status: DC | PRN

## 2021-08-18 MED ORDER — SODIUM CHLORIDE 0.9 % IR SOLN
Status: DC | PRN
  Administered 2021-08-18: 1000 mL

## 2021-08-18 MED ORDER — 0.9 % SODIUM CHLORIDE (POUR BTL) OPTIME
TOPICAL | Status: DC | PRN
  Administered 2021-08-18: 1000 mL

## 2021-08-18 MED ORDER — CHLORHEXIDINE GLUCONATE 0.12 % MT SOLN
15.0000 mL | Freq: Once | OROMUCOSAL | Status: AC
  Administered 2021-08-18: 15 mL via OROMUCOSAL
  Filled 2021-08-18: qty 15

## 2021-08-18 MED ORDER — LIDOCAINE 2% (20 MG/ML) 5 ML SYRINGE
INTRAMUSCULAR | Status: AC
  Filled 2021-08-18: qty 5

## 2021-08-18 MED ORDER — DEXAMETHASONE SODIUM PHOSPHATE 10 MG/ML IJ SOLN
INTRAMUSCULAR | Status: DC | PRN
  Administered 2021-08-18: 10 mg via INTRAVENOUS

## 2021-08-18 SURGICAL SUPPLY — 37 items
BAG COUNTER SPONGE SURGICOUNT (BAG) IMPLANT
BAG SPNG CNTER NS LX DISP (BAG)
BLADE SURG 15 STRL LF DISP TIS (BLADE) ×2 IMPLANT
BLADE SURG 15 STRL SS (BLADE) ×3
BUR CROSS CUT FISSURE 1.6 (BURR) ×3 IMPLANT
BUR EGG ELITE 4.0 (BURR) ×3 IMPLANT
CANISTER SUCT 3000ML PPV (MISCELLANEOUS) ×3 IMPLANT
COVER SURGICAL LIGHT HANDLE (MISCELLANEOUS) ×3 IMPLANT
DECANTER SPIKE VIAL GLASS SM (MISCELLANEOUS) ×3 IMPLANT
DRAPE U-SHAPE 76X120 STRL (DRAPES) ×3 IMPLANT
GAUZE PACKING FOLDED 2  STR (GAUZE/BANDAGES/DRESSINGS) ×3
GAUZE PACKING FOLDED 2 STR (GAUZE/BANDAGES/DRESSINGS) ×2 IMPLANT
GLOVE SURG ENC MOIS LTX SZ6.5 (GLOVE) IMPLANT
GLOVE SURG ENC MOIS LTX SZ7 (GLOVE) IMPLANT
GLOVE SURG ENC MOIS LTX SZ8 (GLOVE) ×3 IMPLANT
GLOVE SURG UNDER POLY LF SZ6.5 (GLOVE) IMPLANT
GLOVE SURG UNDER POLY LF SZ7 (GLOVE) IMPLANT
GOWN STRL REUS W/ TWL LRG LVL3 (GOWN DISPOSABLE) ×2 IMPLANT
GOWN STRL REUS W/ TWL XL LVL3 (GOWN DISPOSABLE) ×2 IMPLANT
GOWN STRL REUS W/TWL LRG LVL3 (GOWN DISPOSABLE) ×3
GOWN STRL REUS W/TWL XL LVL3 (GOWN DISPOSABLE) ×3
IV NS 1000ML (IV SOLUTION) ×3
IV NS 1000ML BAXH (IV SOLUTION) ×2 IMPLANT
KIT BASIN OR (CUSTOM PROCEDURE TRAY) ×3 IMPLANT
KIT TURNOVER KIT B (KITS) ×3 IMPLANT
NDL HYPO 25GX1X1/2 BEV (NEEDLE) ×4 IMPLANT
NEEDLE HYPO 25GX1X1/2 BEV (NEEDLE) ×6 IMPLANT
NS IRRIG 1000ML POUR BTL (IV SOLUTION) ×3 IMPLANT
PAD ARMBOARD 7.5X6 YLW CONV (MISCELLANEOUS) ×3 IMPLANT
SLEEVE IRRIGATION ELITE 7 (MISCELLANEOUS) ×3 IMPLANT
SPONGE SURGIFOAM ABS GEL 12-7 (HEMOSTASIS) IMPLANT
SUT CHROMIC 3 0 PS 2 (SUTURE) ×3 IMPLANT
SYR BULB IRRIG 60ML STRL (SYRINGE) ×3 IMPLANT
SYR CONTROL 10ML LL (SYRINGE) ×3 IMPLANT
TRAY ENT MC OR (CUSTOM PROCEDURE TRAY) ×3 IMPLANT
TUBING IRRIGATION (MISCELLANEOUS) ×3 IMPLANT
YANKAUER SUCT BULB TIP NO VENT (SUCTIONS) ×4 IMPLANT

## 2021-08-18 NOTE — Op Note (Signed)
NAME: Carol Pitts, Carol N. MEDICAL RECORD NO: [id number] ACCOUNT NO: [id number] DATE OF BIRTH: [birth year] FACILITY: [facility] LOCATION: [facility] PHYSICIAN: Georgia Lopes, DDS  Operative Report   DATE OF PROCEDURE: 08/18/2021  PREOPERATIVE DIAGNOSIS:  Nonrestorable teeth secondary to dental caries numbers 2, 3, 4, 6, 7, 8, 9, 10, 11, 13, 14, 15 31.  POSTOPERATIVE DIAGNOSIS:  Nonrestorable teeth secondary to dental caries numbers 2, 3, 4, 6, 7, 8, 9, 10, 11, 13, 14, 15 31.  PROCEDURES:  Extraction of teeth 2, 3, 4, 6, 7, 8, 9, 10, 11, 13, 14, 15 31; alveoplasty, right maxilla.  SURGEON:  Georgia Lopes, DDS  ANESTHESIA:  General, nasal intubation.  DESCRIPTION OF PROCEDURE:  The patient was taken to the operating room and placed on the table in supine position.  General anesthesia was administered intravenously, and nasoendotracheal tube was placed and secured.  The eyes were protected.  The  patient was draped for surgery.  Timeout was performed.  The posterior pharynx was suctioned, and a throat pack was placed.  2% lidocaine 1:100,000 epinephrine was infiltrated in the buccal and palatal aspects of the maxilla around the maxillary teeth  and in an inferior alveolar block in the right mandible.  Bite block was placed on the right side of the mouth. A sweetheart retractor was used to retract the tongue.  The left side of the maxilla was operated first.  A #15 blade was used to make an  incision beginning at tooth number 15, carried forward in the buccal and palatal sulcus across the midline to tooth number 6.  The periosteum was reflected from around the teeth.  Teeth numbers 15, 14, 13 10, 9, 8, and 7 were elevated and removed with  dental forceps.  Tooth number 11 required removal of bone as there was a residual root present.  The root was removed after removing bone with a Stryker handpiece under irrigation and using a 301 elevator.  Then, the sockets were curetted.  The alveolus  was regularly  smooth and minor smoothing of the sharp edges was done with a bone file.  Then, the area was irrigated and closed with 3-0 chromic.  Then, the bite block and sweetheart retractor were repositioned to the other side of the mouth.  A 15 blade  was used to make an incision around tooth number 31.  The periosteum was reflected.  The tooth was elevated, but could not be luxated with the elevator, so a Stryker handpiece with a fissure bur was used to create a bony trough mesially and then the  tooth was elevated and removed with dental forceps.  The socket was curetted and irrigated.  Suture was not required. In the right maxilla, a 15 blade was used to make an incision around teeth numbers 2, 3, 4 and carried forward to tooth number 6.  The  periosteum was reflected around these teeth.  The teeth were elevated.  Tooth number 2 was removed with dental forceps.  Teeth numbers 3 and 4 required removal of bone circumferentially to remove the teeth, and tooth number 6 required removal of  circumferential bone as the crown fractured upon attempted removal with a forceps.  The root was eventually removed using a 301 elevator and the dental forceps.  The sockets were curetted.  The underlying mucosal contour was sharp, so the periosteum was  reflected and bone was trimmed so that the resulting bony contour was smooth. Egg bur and the bone file were used to perform  alveoplasty in the right maxilla to achieve this. Then, the right maxilla was irrigated and closed with 3-0 chromic.  The oral  cavity was then irrigated and suctioned. The throat pack was removed.  The patient was left in the care of anesthesia for extubation and transport to recovery with plans for discharge home through day surgery.  ESTIMATED BLOOD LOSS:  Minimum.  COMPLICATIONS:  None.   [facility] D: 08/18/2021 10:39:01 am T: 08/18/2021 11:32:00 am  JOB: [id number]/ [id number]

## 2021-08-18 NOTE — Anesthesia Postprocedure Evaluation (Signed)
Anesthesia Post Note  Patient: Carol Pitts  Procedure(s) Performed: DENTAL RESTORATION/EXTRACTIONS ALVEOLOPLASTY     Patient location during evaluation: PACU Anesthesia Type: General Level of consciousness: awake and alert and oriented Pain management: pain level controlled Vital Signs Assessment: post-procedure vital signs reviewed and stable Respiratory status: spontaneous breathing, nonlabored ventilation and respiratory function stable Cardiovascular status: blood pressure returned to baseline Postop Assessment: no apparent nausea or vomiting Anesthetic complications: no   No notable events documented.  Last Vitals:  Vitals:   08/18/21 1055 08/18/21 1110  BP: 115/82 114/86  Pulse: 61 (!) 56  Resp: 17 16  Temp:  36.6 C  SpO2: 99% 100%    Last Pain:  Vitals:   08/18/21 0718  TempSrc:   PainSc: 0-No pain                 Shanda Howells

## 2021-08-18 NOTE — Anesthesia Procedure Notes (Signed)
Procedure Name: Intubation Date/Time: 08/18/2021 9:59 AM Performed by: Arville Lime, CRNA Pre-anesthesia Checklist: Patient identified, Emergency Drugs available, Suction available and Patient being monitored Patient Re-evaluated:Patient Re-evaluated prior to induction Oxygen Delivery Method: Circle System Utilized Preoxygenation: Pre-oxygenation with 100% oxygen Induction Type: IV induction Ventilation: Mask ventilation without difficulty Laryngoscope Size: Glidescope and 3 Grade View: Grade I Nasal Tubes: Left Tube size: 7.0 mm Number of attempts: 1 Airway Equipment and Method: Oral airway and Video-laryngoscopy Placement Confirmation: ETT inserted through vocal cords under direct vision, positive ETCO2 and breath sounds checked- equal and bilateral Secured at: 26 cm Tube secured with: Tape Dental Injury: Teeth and Oropharynx as per pre-operative assessment

## 2021-08-18 NOTE — Transfer of Care (Signed)
Immediate Anesthesia Transfer of Care Note  Patient: Carol Pitts  Procedure(s) Performed: DENTAL RESTORATION/EXTRACTIONS ALVEOLOPLASTY  Patient Location: PACU  Anesthesia Type:General  Level of Consciousness: awake and drowsy  Airway & Oxygen Therapy: Patient Spontanous Breathing  Post-op Assessment: Report given to RN and Post -op Vital signs reviewed and stable  Post vital signs: Reviewed and stable  Last Vitals:  Vitals Value Taken Time  BP 119/74 08/18/21 1040  Temp    Pulse 79 08/18/21 1042  Resp 17 08/18/21 1042  SpO2 100 % 08/18/21 1042  Vitals shown include unvalidated device data.  Last Pain:  Vitals:   08/18/21 0718  TempSrc:   PainSc: 0-No pain         Complications: No notable events documented.

## 2021-08-18 NOTE — Op Note (Signed)
08/18/2021  10:34 AM  PATIENT:  [place] N Rietz  30 y.o. female  PRE-OPERATIVE DIAGNOSIS:  NON-RESTORABLE TEETH SECONDARY TO DENTAL CARIES #2, 3, 4, 6, 7, 8, 9, 10, 11, 13, 14, 15, 31.  POST-OPERATIVE DIAGNOSIS:  SAME  PROCEDURE:  Procedure(s): DENTAL EXTRACTIONS TEETH #2, 3, 4, 6, 7, 8, 9, 10, 11, 13, 14, 15, 31. ALVEOLOPLASTY RIGHT MAXILLA ALVEOLOPLASTY  SURGEON:  Surgeon(s): Ocie Doyne, DMD  ANESTHESIA:   local and general  EBL:  minimal  DRAINS: none   SPECIMEN:  No Specimen  COUNTS:  YES  PLAN OF CARE: Discharge to home after PACU  PATIENT DISPOSITION:  PACU - hemodynamically stable.   PROCEDURE DETAILS: Dictation # [id number]  Georgia Lopes, DMD 08/18/2021 10:34 AM

## 2021-08-18 NOTE — Progress Notes (Signed)
No pre-op labs needed per Dr. Stephannie Peters.  Viviano Simas, RN

## 2021-08-18 NOTE — H&P (Signed)
H&P documentation  -History and Physical Reviewed  -Patient has been re-examined  -No change in the plan of care  Carol Pitts  

## 2021-08-19 ENCOUNTER — Encounter (HOSPITAL_COMMUNITY): Payer: Self-pay | Admitting: Oral Surgery

## 2023-01-01 ENCOUNTER — Other Ambulatory Visit: Payer: Self-pay

## 2023-01-01 ENCOUNTER — Emergency Department (HOSPITAL_BASED_OUTPATIENT_CLINIC_OR_DEPARTMENT_OTHER)
Admission: EM | Admit: 2023-01-01 | Discharge: 2023-01-01 | Disposition: A | Discharge status: Home / Self Care | Payer: Medicaid Other | Attending: Emergency Medicine | Admitting: Emergency Medicine

## 2023-01-01 ENCOUNTER — Emergency Department (HOSPITAL_BASED_OUTPATIENT_CLINIC_OR_DEPARTMENT_OTHER): Payer: Medicaid Other

## 2023-01-01 ENCOUNTER — Encounter (HOSPITAL_BASED_OUTPATIENT_CLINIC_OR_DEPARTMENT_OTHER): Payer: Self-pay | Admitting: Urology

## 2023-01-01 DIAGNOSIS — Z1152 Encounter for screening for COVID-19: Secondary | ICD-10-CM | POA: Insufficient documentation

## 2023-01-01 DIAGNOSIS — Z87891 Personal history of nicotine dependence: Secondary | ICD-10-CM | POA: Insufficient documentation

## 2023-01-01 DIAGNOSIS — R519 Headache, unspecified: Secondary | ICD-10-CM | POA: Diagnosis present

## 2023-01-01 LAB — RESP PANEL BY RT-PCR (RSV, FLU A&B, COVID)  RVPGX2
Influenza A by PCR: NEGATIVE
Influenza B by PCR: NEGATIVE
Resp Syncytial Virus by PCR: NEGATIVE
SARS Coronavirus 2 by RT PCR: NEGATIVE

## 2023-01-01 MED ORDER — KETOROLAC TROMETHAMINE 15 MG/ML IJ SOLN
15.0000 mg | Freq: Once | INTRAMUSCULAR | Status: AC
  Administered 2023-01-01: 15 mg via INTRAVENOUS
  Filled 2023-01-01: qty 1

## 2023-01-01 MED ORDER — SODIUM CHLORIDE 0.9 % IV SOLN
12.5000 mg | Freq: Once | INTRAVENOUS | Status: AC
  Administered 2023-01-01: 12.5 mg via INTRAVENOUS
  Filled 2023-01-01: qty 0.5

## 2023-01-01 MED ORDER — SODIUM CHLORIDE 0.9 % IV SOLN
12.5000 mg | Freq: Four times a day (QID) | INTRAVENOUS | Status: DC | PRN
  Filled 2023-01-01: qty 0.5

## 2023-01-01 MED ORDER — ACETAMINOPHEN 500 MG PO TABS
1000.0000 mg | ORAL_TABLET | Freq: Once | ORAL | Status: AC
  Administered 2023-01-01: 1000 mg via ORAL
  Filled 2023-01-01: qty 2

## 2023-01-01 MED ORDER — LACTATED RINGERS IV BOLUS
1000.0000 mL | Freq: Once | INTRAVENOUS | Status: AC
  Administered 2023-01-01: 1000 mL via INTRAVENOUS

## 2023-01-01 MED ORDER — ONDANSETRON HCL 4 MG PO TABS: 4.0000 mg | ORAL_TABLET | Freq: Four times a day (QID) | ORAL | 0 refills | Status: DC

## 2023-01-01 MED ORDER — DIPHENHYDRAMINE HCL 25 MG PO CAPS
25.0000 mg | ORAL_CAPSULE | Freq: Once | ORAL | Status: AC
  Administered 2023-01-01: 25 mg via ORAL
  Filled 2023-01-01: qty 1

## 2023-01-01 NOTE — ED Provider Notes (Signed)
[facility] [place] Provider Note   CSN: [id number] Arrival date & time: 01/01/23  1317     History  Chief Complaint  Patient presents with   Headache    Carol Pitts is a 32 y.o. female with h/o Tobacco use who presents with HA.   Gradual onset worsening headache since Friday. Whole head is hurting, including her face and teeth all over. Sometimes is throbbing. A/w nausea/vomiting. +photophobia. Has had some chills that started this morning. Tried excedrin and tylenol which normally helps her headaches but this one hasn't gone away. Never had a headache like this before. Not normally a headache person. No flu-like symptoms, CP, SOB, abd pain, leg swelling, urinary sxs, numbness/tingling, asymmetric weakness, oropharyngeal abscess/swelling. States she is not currently pregnant or breastfeeding. No sick contacts, no one else has headache, no head trauma.   Headache      Home Medications Prior to Admission medications   Medication Sig Start Date End Date Taking? Authorizing Provider  ondansetron (ZOFRAN) 4 MG tablet Take 1 tablet (4 mg total) by mouth every 6 (six) hours. 01/01/23  Yes Audley Hose, MD  acetaminophen (TYLENOL) 500 MG tablet Take 500-1,000 mg by mouth every 6 (six) hours as needed (tooth pain).    [provider]  amoxicillin (AMOXIL) 500 MG capsule Take 1 capsule (500 mg total) by mouth 3 (three) times daily. 08/18/21   Diona Browner, DMD  HYDROcodone-acetaminophen (NORCO) 5-325 MG tablet Take 1 tablet by mouth every 6 (six) hours as needed for moderate pain. 08/18/21   Diona Browner, DMD      Allergies    Patient has no known allergies.    Review of Systems   Review of Systems  Neurological:  Positive for headaches.   Review of systems Negative for fever.  A 10 point review of systems was performed and is negative unless otherwise reported in HPI.  Physical Exam Updated Vital Signs BP 115/65 (BP Location:  Left Arm)   Pulse 66   Temp 99 F (37.2 C) (Oral)   Resp 16   Ht '5\' 3"'$  (1.6 m)   Wt 59 kg   LMP 12/26/2022 (Approximate)   SpO2 98%   BMI 23.03 kg/m  Physical Exam General: Uncomfortable appearing female, lying in bed.  HEENT: PERRLA, EOMI, Sclera anicteric, MMM, trachea midline.  Cardiology: RRR, no murmurs/rubs/gallops. BL radial and DP pulses equal bilaterally.  Resp: Normal respiratory rate and effort. CTAB, no wheezes, rhonchi, crackles.  Abd: Soft, non-tender, non-distended. No rebound tenderness or guarding.  GU: Deferred. MSK: No peripheral edema or signs of trauma. Extremities without deformity or TTP. No cyanosis or clubbing. Skin: warm, dry. No rashes or lesions. Neuro: A&Ox4, CNs II-XII grossly intact. 5/5 strength in all extremities. Sensation grossly intact. Normal speech, tongue protrudes midline, normal gait.  Psych: Normal mood and affect.   ED Results / Procedures / Treatments   Labs (all labs ordered are listed, but only abnormal results are displayed) Labs Reviewed  RESP PANEL BY RT-PCR (RSV, FLU A&B, COVID)  [id number]    EKG None  Radiology CT Head Wo Contrast  Result Date: 01/01/2023 CLINICAL DATA:  Headache EXAM: CT HEAD WITHOUT CONTRAST TECHNIQUE: Contiguous axial images were obtained from the base of the skull through the vertex without intravenous contrast. RADIATION DOSE REDUCTION: This exam was performed according to the departmental dose-optimization program which includes automated exposure control, adjustment of the mA and/or kV according to patient size and/or use  of iterative reconstruction technique. COMPARISON:  None Available. FINDINGS: Brain: No evidence of acute infarction, hemorrhage, hydrocephalus, extra-axial collection or mass lesion/mass effect. Vascular: No hyperdense vessel or unexpected calcification. Skull: Normal. Negative for fracture or focal lesion. Sinuses/Orbits: No middle ear or mastoid effusion. Paranasal sinuses are clear.  Orbits are unremarkable. Other: None. IMPRESSION: No specific etiology for headaches identified. Electronically Signed   By: Marin Roberts M.D.   On: 01/01/2023 16:42    Procedures Procedures    Medications Ordered in ED Medications  lactated ringers bolus 1,000 mL ( Intravenous Stopped 01/01/23 1620)  acetaminophen (TYLENOL) tablet 1,000 mg (1,000 mg Oral Given 01/01/23 1527)  ketorolac (TORADOL) 15 MG/ML injection 15 mg (15 mg Intravenous Given 01/01/23 1527)  diphenhydrAMINE (BENADRYL) capsule 25 mg (25 mg Oral Given 01/01/23 1528)  promethazine (PHENERGAN) 12.5 mg in sodium chloride 0.9 % 50 mL IVPB (0 mg Intravenous Stopped 01/01/23 1559)    ED Course/ Medical Decision Making/ A&P                          Medical Decision Making Amount and/or Complexity of Data Reviewed Radiology: ordered. Decision-making details documented in ED Course.  Risk OTC drugs. Prescription drug management.    This patient presents to the ED for concern of HA; this involves an extensive number of treatment options, and is a complaint that carries with it a high risk of complications and morbidity.  I considered the following differential and admission for this acute, potentially life threatening condition.   MDM:    Patient is overall very well-appearing, HDS, non-toxic, afebrile.   Unlikely Subdural/epidural hematoma: no history of trauma, no anticoagulation. Unlikely Meningitis: afebrile, no meningismus. Unlikely Temporal arteritis: pt < 47 years old.  no tenderness in temporal area Unlikely Acute angle glaucoma: PERRLA, no eye pain. Unlikely Carbon Monoxide Poisoning: no other house members with similar symptoms.   Most likely 2/2 tension headache, migraine, or headache of non-emergent etiology. A/w nausea/vomiting and photophobia making migraine more likely. Excedrin had worked for her in the past, no h/o similar headaches this severe, will obtain CTH to r/o ICH or hydrocephalus, however she has  no focal neurological symptoms. Neuro exam is benign. Also consider viral URI will obtain swabs. The headache was NOT sudden onset, NOT maximal at onset, there are NO neurologic findings, the patient does NOT have a fever, the patient does NOT have any jaw claudication, the patient does NOT endorse a clotting disorder, patient DENIES any trauma or eye pain and the headache is NOT associated with dizziness or ataxia. Very low concern at this time for ICH, meningitis, temporal arteritis, AACG, CO poisoning. Will treatment the patient symptomatically and reassess.   Clinical Course as of 01/01/23 1916  Mon Jan 01, 2023  1627 HA now 6/10 [HN]  1701 CT Head Wo Contrast No specific etiology for headaches identified. [HN]  1913 Patient with headache 4/10, improved. Nausea improved. Neg resp panel. I performed shared decision making with patient about further treatment for headache vs DC.  She states she would like to be discharged. She is overall well-appearing, HDS, afebrile, no FNDs. Pt will be discharged with zofran rx and instructions to f/u with PCP, can call health connect to establish. DC w/ discharge instructions/return precautions. All questions answered to patient's satisfaction.   [HN]    Clinical Course User Index [HN] Audley Hose, MD    Labs: I Ordered, and personally interpreted labs.  The pertinent  results include:  neg viral panel  Imaging Studies ordered: I ordered imaging studies including CTH I independently visualized and interpreted imaging. I agree with the radiologist interpretation  Additional history obtained from chart review.   Reevaluation: After the interventions noted above, I reevaluated the patient and found that they have :improved  Social Determinants of Health: Patient lives independently   Disposition:  DC w/ discharge instructions/return precautions. All questions answered to patient's satisfaction.    Co morbidities that complicate the patient  evaluation  Past Medical History:  Diagnosis Date   Depression    no meds     Medicines Meds ordered this encounter  Medications   lactated ringers bolus 1,000 mL   acetaminophen (TYLENOL) tablet 1,000 mg   ketorolac (TORADOL) 15 MG/ML injection 15 mg   DISCONTD: promethazine (PHENERGAN) 12.5 mg in sodium chloride 0.9 % 50 mL IVPB   diphenhydrAMINE (BENADRYL) capsule 25 mg   promethazine (PHENERGAN) 12.5 mg in sodium chloride 0.9 % 50 mL IVPB   ondansetron (ZOFRAN) 4 MG tablet    Sig: Take 1 tablet (4 mg total) by mouth every 6 (six) hours.    Dispense:  12 tablet    Refill:  0    I have reviewed the patients home medicines and have made adjustments as needed  Problem List / ED Course: Problem List Items Addressed This Visit   None Visit Diagnoses     Bad headache    -  Primary   Relevant Medications   acetaminophen (TYLENOL) tablet 1,000 mg (Completed)   ketorolac (TORADOL) 15 MG/ML injection 15 mg (Completed)                   This note was created using dictation software, which may contain spelling or grammatical errors.    Audley Hose, MD 01/01/23 [phone]

## 2023-01-01 NOTE — Discharge Instructions (Addendum)
Thank you for coming to [facility] Emergency Department. You were seen for severe headache. We did an exam, labs, and imaging, and these showed no acute findings. You will be discharged with zofran to take as needed every 6-8 hours for nausea/vomiting.  Please follow up with your primary care provider within 1 week. You can call health connect to establish with a primary care physician.  Do not hesitate to return to the ED or call 911 if you experience: -Worsening symptoms -Numbness/tingling, asymmetric weakness, slurred speech, confusion -Nausea/vomiting so severe you cannot keep anything down -Lightheadedness, passing out -Fevers/chills -Anything else that concerns you

## 2023-01-01 NOTE — ED Triage Notes (Signed)
Pt states headache since Friday, chills and n/v that started today  Unable to keep down meds for Headache at this time  Denies fever

## 2023-01-01 NOTE — ED Notes (Signed)
Up to restroom, gait very steady, appears better since ED arrival initially

## 2023-01-02 ENCOUNTER — Emergency Department (HOSPITAL_COMMUNITY)
Admission: EM | Admit: 2023-01-02 | Discharge: 2023-01-02 | Disposition: A | Discharge status: Home / Self Care | Payer: Medicaid Other | Attending: Emergency Medicine | Admitting: Emergency Medicine

## 2023-01-02 ENCOUNTER — Other Ambulatory Visit: Payer: Self-pay

## 2023-01-02 DIAGNOSIS — R110 Nausea: Secondary | ICD-10-CM | POA: Diagnosis not present

## 2023-01-02 DIAGNOSIS — M542 Cervicalgia: Secondary | ICD-10-CM

## 2023-01-02 DIAGNOSIS — R519 Headache, unspecified: Secondary | ICD-10-CM | POA: Diagnosis present

## 2023-01-02 LAB — BASIC METABOLIC PANEL
Anion gap: 11 (ref 5–15)
BUN: 10 mg/dL (ref 6–20)
CO2: 24 mmol/L (ref 22–32)
Calcium: 9 mg/dL (ref 8.9–10.3)
Chloride: 102 mmol/L (ref 98–111)
Creatinine, Ser: 0.68 mg/dL (ref 0.44–1.00)
GFR, Estimated: >60 mL/min (ref >60)
Glucose, Bld: 88 mg/dL (ref 70–99)
Potassium: 3.5 mmol/L (ref 3.5–5.1)
Sodium: 137 mmol/L (ref 135–145)

## 2023-01-02 LAB — CBC WITH DIFFERENTIAL/PLATELET
Abs Immature Granulocytes: 0.01 10*3/uL (ref 0.00–0.07)
Basophils Absolute: 0 10*3/uL (ref 0.0–0.1)
Basophils Relative: 0 %
Eosinophils Absolute: 0 10*3/uL (ref 0.0–0.5)
Eosinophils Relative: 0 %
HCT: 36.4 % (ref 36.0–46.0)
Hemoglobin: 12.5 g/dL (ref 12.0–15.0)
Immature Granulocytes: 0 %
Lymphocytes Relative: 16 %
Lymphs Abs: 1.3 10*3/uL (ref 0.7–4.0)
MCH: 31.2 pg (ref 26.0–34.0)
MCHC: 34.3 g/dL (ref 30.0–36.0)
MCV: 90.8 fL (ref 80.0–100.0)
Monocytes Absolute: 0.7 10*3/uL (ref 0.1–1.0)
Monocytes Relative: 9 %
Neutro Abs: 6 10*3/uL (ref 1.7–7.7)
Neutrophils Relative %: 75 %
Platelets: 166 10*3/uL (ref 150–400)
RBC: 4.01 MIL/uL (ref 3.87–5.11)
RDW: 11.8 % (ref 11.5–15.5)
WBC: 8 10*3/uL (ref 4.0–10.5)
nRBC: 0 % (ref 0.0–0.2)

## 2023-01-02 LAB — I-STAT BETA HCG BLOOD, ED (MC, WL, AP ONLY): I-stat hCG, quantitative: <5 m[IU]/mL (ref <5)

## 2023-01-02 MED ORDER — CYCLOBENZAPRINE HCL 10 MG PO TABS: 10.0000 mg | ORAL_TABLET | Freq: Two times a day (BID) | ORAL | 0 refills | Status: DC | PRN

## 2023-01-02 MED ORDER — DEXAMETHASONE SODIUM PHOSPHATE 4 MG/ML IJ SOLN
4.0000 mg | Freq: Once | INTRAMUSCULAR | Status: AC
  Administered 2023-01-02: 4 mg via INTRAVENOUS
  Filled 2023-01-02: qty 1

## 2023-01-02 MED ORDER — CYCLOBENZAPRINE HCL 10 MG PO TABS
5.0000 mg | ORAL_TABLET | Freq: Once | ORAL | Status: AC
  Administered 2023-01-02: 5 mg via ORAL
  Filled 2023-01-02: qty 1

## 2023-01-02 MED ORDER — DIPHENHYDRAMINE HCL 50 MG/ML IJ SOLN
25.0000 mg | Freq: Once | INTRAMUSCULAR | Status: AC
  Administered 2023-01-02: 25 mg via INTRAVENOUS
  Filled 2023-01-02: qty 1

## 2023-01-02 MED ORDER — PROCHLORPERAZINE EDISYLATE 10 MG/2ML IJ SOLN
10.0000 mg | Freq: Once | INTRAMUSCULAR | Status: AC
  Administered 2023-01-02: 10 mg via INTRAVENOUS
  Filled 2023-01-02: qty 2

## 2023-01-02 MED ORDER — PROCHLORPERAZINE MALEATE 10 MG PO TABS: 10.0000 mg | ORAL_TABLET | Freq: Two times a day (BID) | ORAL | 0 refills | Status: DC | PRN

## 2023-01-02 MED ORDER — LACTATED RINGERS IV BOLUS
1000.0000 mL | Freq: Once | INTRAVENOUS | Status: AC
  Administered 2023-01-02: 1000 mL via INTRAVENOUS

## 2023-01-02 MED ORDER — KETOROLAC TROMETHAMINE 30 MG/ML IJ SOLN
30.0000 mg | Freq: Once | INTRAMUSCULAR | Status: AC
  Administered 2023-01-02: 30 mg via INTRAVENOUS
  Filled 2023-01-02: qty 1

## 2023-01-02 NOTE — Discharge Instructions (Addendum)
Thank you for allowing me to be part of your care today.  You received a different migraine cocktail in the ED today and a muscle relaxant.  I am sending you home with prescriptions for Compazine and Flexeril.  Do not drive while taking Flexeril as this medication may make you sleepy or dizzy.  I highly recommend scheduling a neurology appointment as soon as possible for a follow-up.    Please be sure to get rest and drink plenty of fluids.  I recommend taking 800 mg of ibuprofen alternating with 1000 mg of Tylenol every 4 hours as needed for headache.  Do not exceed 3200 mg of ibuprofen or 4000 mg of Tylenol in a 24-hour period.  Return to the ED if you have worsening of your symptoms, develop fever, have weakness on one side of your body, experience loss of consciousness, or have any new concerns.

## 2023-01-02 NOTE — ED Provider Triage Note (Signed)
Emergency Medicine Provider Triage Evaluation Note  Carol Pitts , a 32 y.o. female  was evaluated in triage.  Patient presenting with a headache.  Has been present since Friday.  Saturday she started to throw up and could not hold anything down.  She was seen today at [facility] with negative imaging and labs.  She says that she was treated with medications and discharged home with nausea medication but only felt slightly better at discharge.  Says that the pain has come back.  Endorses some tingling to the left face.  No history of CVA or diagnosed migraine disorder however usually Tylenol and Excedrin without her headaches  Review of Systems  Positive:  Negative:   Physical Exam  BP 113/72   Pulse 77   Temp 98.8 F (37.1 C) (Oral)   Resp 16   LMP 12/26/2022 (Approximate)   SpO2 99%  Gen:   Awake, no distress   Resp:  Normal effort  MSK:   Moves extremities without difficulty  Other:  Cranial nerves II through XII grossly intact.  Full range of motion and normal strength in bilateral upper and lower extremities.  Medical Decision Making  Medically screening exam initiated at 4:23 PM.  Appropriate orders placed.  Carol N Freeze was informed that the remainder of the evaluation will be completed by another provider, this initial triage assessment does not replace that evaluation, and the importance of remaining in the ED until their evaluation is complete.    Do not believe we need to repeat any imaging.  Likely just needs migraine cocktail.  Will get labs that were not ordered yesterday   Rhae Hammock, PA-C 01/02/23 1625

## 2023-01-02 NOTE — ED Provider Notes (Signed)
[place] Provider Note   CSN: [id number] Arrival date & time: 01/02/23  1604     History  Chief Complaint  Patient presents with   Migraine    Carol Pitts is a 32 y.o. female presents to the ED complaining of headache and photophobia since Friday.  She states the pain is now radiating down into both sides of her neck and she feels tingling in the left side of her face.  Patient has also had nausea and dry heaving.  She was seen at [facility] yesterday and given a migraine cocktail and prescription for Zofran, which she has not taken yet.  Patient has no known history of migraines, but does have family history significant for migraines and MS.  Denies fever, flu-like symptoms, chest pain, shortness of breath, numbness or weakness in extremities, congestion, trauma or head injury.        Home Medications Prior to Admission medications   Medication Sig Start Date End Date Taking? Authorizing Provider  acetaminophen (TYLENOL) 500 MG tablet Take 500-1,000 mg by mouth every 6 (six) hours as needed (tooth pain).    [provider]  amoxicillin (AMOXIL) 500 MG capsule Take 1 capsule (500 mg total) by mouth 3 (three) times daily. 08/18/21   Diona Browner, DMD  cyclobenzaprine (FLEXERIL) 10 MG tablet Take 1 tablet (10 mg total) by mouth 2 (two) times daily as needed for muscle spasms. 01/02/23  Yes Cypress Hinkson R, PA  HYDROcodone-acetaminophen (NORCO) 5-325 MG tablet Take 1 tablet by mouth every 6 (six) hours as needed for moderate pain. 08/18/21   Diona Browner, DMD  ondansetron (ZOFRAN) 4 MG tablet Take 1 tablet (4 mg total) by mouth every 6 (six) hours. 01/01/23   Audley Hose, MD  prochlorperazine (COMPAZINE) 10 MG tablet Take 1 tablet (10 mg total) by mouth 2 (two) times daily as needed for nausea or vomiting. 01/02/23  Yes Theressa Stamps R, PA      Allergies    Patient has no known allergies.    Review of Systems    Review of Systems  Constitutional:  Negative for chills and fever.  HENT:  Negative for sinus pressure and sinus pain.   Eyes:  Positive for photophobia. Negative for visual disturbance.  Respiratory:  Negative for shortness of breath.   Cardiovascular:  Negative for chest pain.  Gastrointestinal:  Positive for nausea. Negative for abdominal pain, diarrhea and vomiting.  Musculoskeletal:  Positive for neck pain. Negative for back pain and neck stiffness.  Neurological:  Positive for headaches. Negative for dizziness, syncope, facial asymmetry, weakness and light-headedness.    Physical Exam Updated Vital Signs BP 113/72   Pulse 77   Temp 98.8 F (37.1 C) (Oral)   Resp 16   LMP 12/26/2022 (Approximate)   SpO2 99%  Physical Exam Vitals and nursing note reviewed.  Constitutional:      General: She is not in acute distress.    Appearance: She is not ill-appearing.  HENT:     Mouth/Throat:     Mouth: Mucous membranes are moist.     Pharynx: Oropharynx is clear.  Eyes:     General: Lids are normal.     Extraocular Movements: Extraocular movements intact.     Conjunctiva/sclera: Conjunctivae normal.     Pupils: Pupils are equal, round, and reactive to light.     Comments: Patient reports subjective pain with EOM  Cardiovascular:     Rate and  Rhythm: Normal rate and regular rhythm.     Pulses: Normal pulses.     Heart sounds: Normal heart sounds.  Pulmonary:     Effort: Pulmonary effort is normal. No respiratory distress.     Breath sounds: Normal breath sounds and air entry.  Abdominal:     General: Abdomen is flat. Bowel sounds are normal. There is no distension.     Palpations: Abdomen is soft.     Tenderness: There is no abdominal tenderness.  Musculoskeletal:     Cervical back: Normal range of motion. Pain with movement (With right and left lateral rotation, left > right) and muscular tenderness (paracervical tenderness) present. No spinous process tenderness.  Skin:     General: Skin is warm and dry.     Capillary Refill: Capillary refill takes less than 2 seconds.  Neurological:     General: No focal deficit present.     Mental Status: She is alert. Mental status is at baseline.     Cranial Nerves: Cranial nerves 2-12 are intact.     Sensory: Sensation is intact.     Motor: Motor function is intact.     Coordination: Coordination is intact.     Gait: Gait is intact.     Comments: Cranial Nerves:  II: peripheral fields grossly intact III,IV, VI: ptosis not present, extra-ocular movements intact bilaterally, direct and consensual pupillary light reflexes intact bilaterally V: facial sensation, jaw opening, and bite strength equal bilaterally VII: eyebrow raise, eyelid close, smile, frown, pucker equal bilaterally VIII: hearing grossly normal bilaterally  IX,X: palate elevation and swallowing intact XI: bilateral shoulder shrug and lateral head rotation equal and strong XII: midline tongue extension   Psychiatric:        Mood and Affect: Mood normal.        Behavior: Behavior normal.     ED Results / Procedures / Treatments   Labs (all labs ordered are listed, but only abnormal results are displayed) Labs Reviewed  CBC WITH DIFFERENTIAL/PLATELET  BASIC METABOLIC PANEL  I-STAT BETA HCG BLOOD, ED ([facility], [facility], [facility] ONLY)    EKG None  Radiology CT Head Wo Contrast  Result Date: 01/01/2023 CLINICAL DATA:  Headache EXAM: CT HEAD WITHOUT CONTRAST TECHNIQUE: Contiguous axial images were obtained from the base of the skull through the vertex without intravenous contrast. RADIATION DOSE REDUCTION: This exam was performed according to the departmental dose-optimization program which includes automated exposure control, adjustment of the mA and/or kV according to patient size and/or use of iterative reconstruction technique. COMPARISON:  None Available. FINDINGS: Brain: No evidence of acute infarction, hemorrhage, hydrocephalus, extra-axial collection or mass  lesion/mass effect. Vascular: No hyperdense vessel or unexpected calcification. Skull: Normal. Negative for fracture or focal lesion. Sinuses/Orbits: No middle ear or mastoid effusion. Paranasal sinuses are clear. Orbits are unremarkable. Other: None. IMPRESSION: No specific etiology for headaches identified. Electronically Signed   By: Marin Roberts M.D.   On: 01/01/2023 16:42    Procedures Procedures    Medications Ordered in ED Medications  ketorolac (TORADOL) 30 MG/ML injection 30 mg (30 mg Intravenous Given 01/02/23 1802)  prochlorperazine (COMPAZINE) injection 10 mg (10 mg Intravenous Given 01/02/23 1800)  lactated ringers bolus 1,000 mL (0 mLs Intravenous Stopped 01/02/23 1913)  diphenhydrAMINE (BENADRYL) injection 25 mg (25 mg Intravenous Given 01/02/23 1759)  dexamethasone (DECADRON) injection 4 mg (4 mg Intravenous Given 01/02/23 1801)  cyclobenzaprine (FLEXERIL) tablet 5 mg (5 mg Oral Given 01/02/23 1904)    ED Course/ Medical Decision  Making/ A&P                             Medical Decision Making Risk Prescription drug management.   This patient presents to the ED with chief complaint(s) of severe headache and neck pain with associated nausea and dry heaving.  The complaint involves an extensive differential diagnosis and also carries with it a high risk of complications and morbidity.    The differential diagnosis includes migraine headache, atypical migraine headache, tension headache, cluster headache; very low suspicion for meningitis given she has been afebrile and has no nuchal rigidity   The initial plan is to obtain baseline labs   Additional history obtained: Additional history obtained from family; patient's mother at bedside who states patient has no known history of headaches, but she personally has migraines and MS and was diagnosed in her early 68s  Initial Assessment:   Patient appears to be in pain, is covering her head with a blanket and is wearing  sunglasses due to light bothering her.  She has tenderness to palpation of paracervical muscles and has pain with right and left rotation, with left being worse than right.  She has normal passive flexion and extension of the neck.  PERRLA.  EOM intact, patient reports some pain with eye movement.  5/5 strength in upper and lower extremities.  Sensation is intact.  She is moving all extremities appropriately.  She is able to sit up and lie down without assistance.    Independent ECG/labs interpretation:  The following labs were independently interpreted:  CBC without leukocytosis or anemia.  Metabolic panel reveals no evidence of electrolyte disturbances.  Renal function is normal.  Pregnancy test is negative.  Independent visualization and interpretation of imaging: I independently visualized the following imaging with scope of interpretation limited to determining acute life threatening conditions related to emergency care: Not indicated.  Patient had head CT performed yesterday at [facility] which was negative for acute intracranial abnormality.  Treatment and Reassessment: Patient treated with a a migraine cocktail consisting of a higher dose of Toradol, Decadron, Compazine, and Benadryl.  Patient continues complaining of neck pain, will give Flexeril and reassess.    Patient reports feeling somewhat better after interventions.  Discussed with patient Decadron will continue to work over the next couple of days.  Will send patient home on Flexeril and Compazine to use for headache, neck pain and nausea.  Disposition:   Will recommend that patient follows up outpatient with neurology.  Patient's mother has preferred neurologist that she would like patient to see and will coordinate scheduling an appointment with them.  The patient has been appropriately medically screened and/or stabilized in the ED. I have low suspicion for any other emergent medical condition which would require further  screening, evaluation or treatment in the ED or require inpatient management. At time of discharge the patient is hemodynamically stable and in no acute distress. I have discussed work-up results and diagnosis with patient and answered all questions. Patient is agreeable with discharge plan. We discussed strict return precautions for returning to the emergency department and they verbalized understanding.            Final Clinical Impression(s) / ED Diagnoses Final diagnoses:  Bad headache  Neck pain  Nausea    Rx / DC Orders ED Discharge Orders          Ordered    cyclobenzaprine (FLEXERIL) 10  MG tablet  2 times daily PRN        01/02/23 2003    prochlorperazine (COMPAZINE) 10 MG tablet  2 times daily PRN        01/02/23 2003              Theressa Stamps [place], [place] 01/02/23 2007    Drenda Freeze, MD 01/02/23 2053

## 2023-01-02 NOTE — ED Triage Notes (Signed)
Pt reports headache and photophobia since Friday. Nausea and vomiting since Saturday. Seen yesterday for same at [facility] and had negative CT and given rx for Zofran which she has not taken yet. No hx of migraines.

## 2023-02-25 NOTE — Progress Notes (Unsigned)
[facility]  PATIENT: Carol Pitts DOB: [birth year]  REFERRING DOCTOR OR PCP: Referred from emergency room SOURCE: Patient, notes from recent emergency room visit, imaging and lab reports, CT scan images personally reviewed.  _________________________________   HISTORICAL  CHIEF COMPLAINT:  No chief complaint on file.   HISTORY OF PRESENT ILLNESS:  I had the pleasure seeing your patient, Carol Pitts, at [facility] for neurologic consultation regarding her headaches.   She presented to the emergency room 01/01/2023 with several days of holocephalic headache with throbbing quality and associated with nausea, vomiting and photophobia.  In the emergency room,  she was given a cocktail of acetaminophen, ketorolac, diphenhydramine and promethazine.  CT scan of the head was performed which was normal.  She had some upper respiratory symptoms and COVID-19 and influenza were tested and negative.  She was discharged.  She went back to the emergency room the next day.  Laboratory tests were obtained (negative CBC and BMP) she was given diphenhydramine, prochlorperazine, ketorolac and dexamethasone and discharged.   Imaging: CT scan of the head 01/01/2023 showed no acute findings.  Incidental note of choroid plexus xanthogranulomatas, a benign finding.     REVIEW OF SYSTEMS: Constitutional: No fevers, chills, sweats, or change in appetite Eyes: No visual changes, double vision, eye pain Ear, nose and throat: No hearing loss, ear pain, nasal congestion, sore throat Cardiovascular: No chest pain, palpitations Respiratory:  No shortness of breath at rest or with exertion.   No wheezes GastrointestinaI: No nausea, vomiting, diarrhea, abdominal pain, fecal incontinence Genitourinary:  No dysuria, urinary retention or frequency.  No nocturia. Musculoskeletal:  No neck pain, back pain Integumentary: No rash, pruritus, skin lesions Neurological: as  above Psychiatric: No depression at this time.  No anxiety Endocrine: No palpitations, diaphoresis, change in appetite, change in weigh or increased thirst Hematologic/Lymphatic:  No anemia, purpura, petechiae. Allergic/Immunologic: No itchy/runny eyes, nasal congestion, recent allergic reactions, rashes  ALLERGIES: No Known Allergies  HOME MEDICATIONS:  Current Outpatient Medications:    acetaminophen (TYLENOL) 500 MG tablet, Take 500-1,000 mg by mouth every 6 (six) hours as needed (tooth pain)., Disp: , Rfl:    amoxicillin (AMOXIL) 500 MG capsule, Take 1 capsule (500 mg total) by mouth 3 (three) times daily., Disp: 21 capsule, Rfl: 0   cyclobenzaprine (FLEXERIL) 10 MG tablet, Take 1 tablet (10 mg total) by mouth 2 (two) times daily as needed for muscle spasms., Disp: 20 tablet, Rfl: 0   HYDROcodone-acetaminophen (NORCO) 5-325 MG tablet, Take 1 tablet by mouth every 6 (six) hours as needed for moderate pain., Disp: 20 tablet, Rfl: 0   ondansetron (ZOFRAN) 4 MG tablet, Take 1 tablet (4 mg total) by mouth every 6 (six) hours., Disp: 12 tablet, Rfl: 0   prochlorperazine (COMPAZINE) 10 MG tablet, Take 1 tablet (10 mg total) by mouth 2 (two) times daily as needed for nausea or vomiting., Disp: 10 tablet, Rfl: 0  PAST MEDICAL HISTORY: Past Medical History:  Diagnosis Date   Depression    no meds    PAST SURGICAL HISTORY: Past Surgical History:  Procedure Laterality Date   ALVEOLOPLASTY  08/18/2021   Procedure: ALVEOLOPLASTY;  Surgeon: Ocie Doyne, DMD;  Location: [facility] OR;  Service: Oral Surgery;;   DILATION AND EVACUATION  01/31/2018   TOOTH EXTRACTION N/A 08/18/2021   Procedure: DENTAL RESTORATION/EXTRACTIONS;  Surgeon: Ocie Doyne, DMD;  Location: [facility] OR;  Service: Oral Surgery;  Laterality: N/A;   TUBAL LIGATION  06/2019   laparoscopic  WISDOM TOOTH EXTRACTION      FAMILY HISTORY: No family history on file.  SOCIAL HISTORY: Social History   Socioeconomic History    Marital status: Single    Spouse name: Not on file   Number of children: Not on file   Years of education: Not on file   Highest education level: Not on file  Occupational History   Not on file  Tobacco Use   Smoking status: Every Day    Packs/day: 0.75    Years: 12.00    Additional pack years: 0.00    Total pack years: 9.00    Types: Cigarettes   Smokeless tobacco: Never  Vaping Use   Vaping Use: Never used  Substance and Sexual Activity   Alcohol use: No   Drug use: Yes    Types: Marijuana    Comment: Last use 08/17/21   Sexual activity: Not on file    Comment: tubal ligation  Other Topics Concern   Not on file  Social History Narrative   Not on file   Social Determinants of Health   Financial Resource Strain: Not on file  Food Insecurity: Not on file  Transportation Needs: Not on file  Physical Activity: Not on file  Stress: Not on file  Social Connections: Not on file  Intimate Partner Violence: Not on file       PHYSICAL EXAM  There were no vitals filed for this visit.  There is no height or weight on file to calculate BMI.   General: The patient is well-developed and well-nourished and in no acute distress  HEENT:  Head is [place]/AT.  Sclera are anicteric.  Funduscopic exam shows normal optic discs and retinal vessels.  Neck: No carotid bruits are noted.  The neck is nontender.  Cardiovascular: The heart has a regular rate and rhythm with a normal S1 and S2. There were no murmurs, gallops or rubs.    Skin: Extremities are without rash or  edema.  Musculoskeletal:  Back is nontender  Neurologic Exam  Mental status: The patient is alert and oriented x 3 at the time of the examination. The patient has apparent normal recent and remote memory, with an apparently normal attention span and concentration ability.   Speech is normal.  Cranial nerves: Extraocular movements are full. Pupils are equal, round, and reactive to light and accomodation.  Visual  fields are full.  Facial symmetry is present. There is good facial sensation to soft touch bilaterally.Facial strength is normal.  Trapezius and sternocleidomastoid strength is normal. No dysarthria is noted.  The tongue is midline, and the patient has symmetric elevation of the soft palate. No obvious hearing deficits are noted.  Motor:  Muscle bulk is normal.   Tone is normal. Strength is  5 / 5 in all 4 extremities.   Sensory: Sensory testing is intact to pinprick, soft touch and vibration sensation in all 4 extremities.  Coordination: Cerebellar testing reveals good finger-nose-finger and heel-to-shin bilaterally.  Gait and station: Station is normal.   Gait is normal. Tandem gait is normal. Romberg is negative.   Reflexes: Deep tendon reflexes are symmetric and normal bilaterally.   Plantar responses are flexor.    DIAGNOSTIC DATA (LABS, IMAGING, TESTING) - I reviewed patient records, labs, notes, testing and imaging myself where available.  Lab Results  Component Value Date   WBC 8.0 01/02/2023   HGB 12.5 01/02/2023   HCT 36.4 01/02/2023   MCV 90.8 01/02/2023   PLT 166 01/02/2023  Component Value Date/Time   NA 137 01/02/2023 1646   K 3.5 01/02/2023 1646   CL 102 01/02/2023 1646   CO2 24 01/02/2023 1646   GLUCOSE 88 01/02/2023 1646   BUN 10 01/02/2023 1646   CREATININE 0.68 01/02/2023 1646   CALCIUM 9.0 01/02/2023 1646   PROT 7.8 05/05/2018 0827   ALBUMIN 4.6 05/05/2018 0827   AST 23 05/05/2018 0827   ALT 13 05/05/2018 0827   ALKPHOS 42 05/05/2018 0827   BILITOT 1.1 05/05/2018 0827   GFRNONAA >60 01/02/2023 1646   GFRAA >60 05/05/2018 0827   No results found for: "CHOL", "HDL", "LDLCALC", "LDLDIRECT", "TRIG", "CHOLHDL" No results found for: "HGBA1C" No results found for: "VITAMINB12" No results found for: "TSH"     ASSESSMENT AND PLAN  ***   Liandra Mendia A. Epimenio Foot, MD, [facility] 02/25/2023, 1:09 PM Certified in Neurology, Clinical Neurophysiology, Sleep  Medicine and Neuroimaging  [facility] [address], [address] [place], [place] [address] [phone]

## 2023-02-26 ENCOUNTER — Encounter: Payer: Self-pay | Admitting: Neurology

## 2023-02-26 ENCOUNTER — Ambulatory Visit (INDEPENDENT_AMBULATORY_CARE_PROVIDER_SITE_OTHER): Payer: Medicaid Other | Admitting: Neurology

## 2023-02-26 VITALS — BP 129/88 | HR 78 | Ht 62.0 in | Wt 137.0 lb

## 2023-02-26 DIAGNOSIS — G43709 Chronic migraine without aura, not intractable, without status migrainosus: Secondary | ICD-10-CM

## 2023-02-26 DIAGNOSIS — R519 Headache, unspecified: Secondary | ICD-10-CM

## 2023-02-26 MED ORDER — RIZATRIPTAN BENZOATE 10 MG PO TBDP: 10.0000 mg | ORAL_TABLET | ORAL | 11 refills | Status: DC | PRN

## 2023-02-26 MED ORDER — METHYLPREDNISOLONE 4 MG PO TABS: ORAL_TABLET | ORAL | 0 refills | Status: DC

## 2023-02-26 MED ORDER — TOPIRAMATE 50 MG PO TABS: ORAL_TABLET | ORAL | 11 refills | Status: DC

## 2023-03-12 ENCOUNTER — Encounter: Payer: Self-pay | Admitting: Neurology

## 2023-03-13 ENCOUNTER — Other Ambulatory Visit: Payer: Self-pay | Admitting: *Deleted

## 2023-03-13 DIAGNOSIS — G43709 Chronic migraine without aura, not intractable, without status migrainosus: Secondary | ICD-10-CM

## 2023-03-13 MED ORDER — TOPIRAMATE 100 MG PO TABS
ORAL_TABLET | ORAL | 5 refills | Status: DC
Start: 2023-03-13 — End: 2024-09-03

## 2023-07-07 ENCOUNTER — Other Ambulatory Visit: Payer: Self-pay

## 2023-07-07 ENCOUNTER — Encounter (HOSPITAL_BASED_OUTPATIENT_CLINIC_OR_DEPARTMENT_OTHER): Payer: Self-pay | Admitting: Emergency Medicine

## 2023-07-07 ENCOUNTER — Emergency Department (HOSPITAL_BASED_OUTPATIENT_CLINIC_OR_DEPARTMENT_OTHER)
Admission: EM | Admit: 2023-07-07 | Discharge: 2023-07-07 | Disposition: A | Discharge status: Home / Self Care | Payer: Medicaid Other | Attending: Emergency Medicine | Admitting: Emergency Medicine

## 2023-07-07 DIAGNOSIS — R42 Dizziness and giddiness: Secondary | ICD-10-CM

## 2023-07-07 DIAGNOSIS — E860 Dehydration: Secondary | ICD-10-CM | POA: Diagnosis not present

## 2023-07-07 HISTORY — DX: Migraine, unspecified, not intractable, without status migrainosus: G43.909

## 2023-07-07 LAB — URINALYSIS, MICROSCOPIC (REFLEX)

## 2023-07-07 LAB — CBC
HCT: 38.3 % (ref 36.0–46.0)
Hemoglobin: 13.1 g/dL (ref 12.0–15.0)
MCH: 31 pg (ref 26.0–34.0)
MCHC: 34.2 g/dL (ref 30.0–36.0)
MCV: 90.8 fL (ref 80.0–100.0)
Platelets: 204 10*3/uL (ref 150–400)
RBC: 4.22 MIL/uL (ref 3.87–5.11)
RDW: 11.9 % (ref 11.5–15.5)
WBC: 11.1 10*3/uL — ABNORMAL HIGH (ref 4.0–10.5)
nRBC: 0 % (ref 0.0–0.2)

## 2023-07-07 LAB — BASIC METABOLIC PANEL
Anion gap: 10 (ref 5–15)
BUN: 10 mg/dL (ref 6–20)
CO2: 20 mmol/L — ABNORMAL LOW (ref 22–32)
Calcium: 9.1 mg/dL (ref 8.9–10.3)
Chloride: 106 mmol/L (ref 98–111)
Creatinine, Ser: 0.7 mg/dL (ref 0.44–1.00)
GFR, Estimated: >60 mL/min (ref >60)
Glucose, Bld: 96 mg/dL (ref 70–99)
Potassium: 3.4 mmol/L — ABNORMAL LOW (ref 3.5–5.1)
Sodium: 136 mmol/L (ref 135–145)

## 2023-07-07 LAB — URINALYSIS, ROUTINE W REFLEX MICROSCOPIC
Bilirubin Urine: NEGATIVE
Glucose, UA: NEGATIVE mg/dL
Ketones, ur: >=80 mg/dL — AB
Nitrite: POSITIVE — AB
Protein, ur: 30 mg/dL — AB
Specific Gravity, Urine: >=1.03 (ref 1.005–1.030)
pH: 5.5 (ref 5.0–8.0)

## 2023-07-07 LAB — PREGNANCY, URINE: Preg Test, Ur: NEGATIVE

## 2023-07-07 MED ORDER — LACTATED RINGERS IV BOLUS
1000.0000 mL | Freq: Once | INTRAVENOUS | Status: AC
  Administered 2023-07-07: 1000 mL via INTRAVENOUS

## 2023-07-07 NOTE — ED Provider Notes (Signed)
[facility] Provider Note   CSN: [id number] Arrival date & time: 07/07/23  1605     History {Add pertinent medical, surgical, social history, OB history to HPI:1} Chief Complaint  Patient presents with   Generalized Body Aches   Dizziness    Carol Pitts is a 32 y.o. female.  She was sent here from urgent care after they evaluated her for feeling very fatigued with backache and bodyaches that started yesterday.  She said she has not been drinking much fluids and works in a hot enclosed space.  She denies any shortness of breath nausea vomiting diarrhea or urinary symptoms.  She had a COVID and a flu test done at urgent care that was negative.  She feels she needs some IV fluids.  She denies any other concerns  The history is provided by the patient.  Dizziness Quality:  Lightheadedness Severity:  Moderate Onset quality:  Gradual Duration:  2 days Timing:  Intermittent Progression:  Unchanged Chronicity:  New Associated symptoms: no chest pain, no diarrhea, no nausea, no shortness of breath, no syncope, no vision changes and no vomiting        Home Medications Prior to Admission medications   Medication Sig Start Date End Date Taking? Authorizing Provider  acetaminophen (TYLENOL) 500 MG tablet Take 500-1,000 mg by mouth every 6 (six) hours as needed (tooth pain).    [provider]  cyclobenzaprine (FLEXERIL) 10 MG tablet Take 1 tablet (10 mg total) by mouth 2 (two) times daily as needed for muscle spasms. 01/02/23   Clark, Meghan R, PA-C  methylPREDNISolone (MEDROL) 4 MG tablet Taper from 6 pills po for one day to 1 pill po the last day over 6 days 02/26/23   Sater, Pearletha Furl, MD  ondansetron (ZOFRAN) 4 MG tablet Take 1 tablet (4 mg total) by mouth every 6 (six) hours. 01/01/23   Loetta Rough, MD  prochlorperazine (COMPAZINE) 10 MG tablet Take 1 tablet (10 mg total) by mouth 2 (two) times daily as needed for nausea or  vomiting. 01/02/23   Melton Alar R, PA-C  rizatriptan (MAXALT-MLT) 10 MG disintegrating tablet Take 1 tablet (10 mg total) by mouth as needed for migraine. May repeat in 2 hours if needed.  No more than 2/day or 4/week 02/26/23   Sater, Pearletha Furl, MD  topiramate (TOPAMAX) 100 MG tablet Take 2 tablets by mouth at bedtime 03/13/23   Sater, Pearletha Furl, MD      Allergies    Patient has no known allergies.    Review of Systems   Review of Systems  Constitutional:  Positive for fatigue.  Respiratory:  Negative for shortness of breath.   Cardiovascular:  Negative for chest pain and syncope.  Gastrointestinal:  Negative for diarrhea, nausea and vomiting.  Musculoskeletal:  Positive for back pain and myalgias.  Neurological:  Positive for dizziness.    Physical Exam Updated Vital Signs BP 101/71 (BP Location: Left Arm)   Pulse 82   Temp 98.7 F (37.1 C) (Oral)   Resp 16   Ht 5\' 3"  (1.6 m)   Wt 59 kg   LMP 06/28/2023 (Exact Date)   SpO2 97%   BMI 23.03 kg/m  Physical Exam Vitals and nursing note reviewed.  Constitutional:      General: She is not in acute distress.    Appearance: Normal appearance. She is well-developed.  HENT:     Head: Normocephalic and atraumatic.  Eyes:  Conjunctiva/sclera: Conjunctivae normal.  Cardiovascular:     Rate and Rhythm: Normal rate and regular rhythm.     Heart sounds: No murmur heard. Pulmonary:     Effort: Pulmonary effort is normal. No respiratory distress.     Breath sounds: Normal breath sounds.  Abdominal:     Palpations: Abdomen is soft.     Tenderness: There is no abdominal tenderness. There is no guarding or rebound.  Musculoskeletal:        General: No swelling.     Cervical back: Neck supple.  Skin:    General: Skin is warm and dry.     Capillary Refill: Capillary refill takes less than 2 seconds.  Neurological:     General: No focal deficit present.     Mental Status: She is alert.     ED Results / Procedures / Treatments    Labs (all labs ordered are listed, but only abnormal results are displayed) Labs Reviewed  CBC - Abnormal; Notable for the following components:      Result Value   WBC 11.1 (*)    All other components within normal limits  BASIC METABOLIC PANEL - Abnormal; Notable for the following components:   Potassium 3.4 (*)    CO2 20 (*)    All other components within normal limits  URINALYSIS, ROUTINE W REFLEX MICROSCOPIC - Abnormal; Notable for the following components:   Hgb urine dipstick MODERATE (*)    Ketones, ur >=80 (*)    Protein, ur 30 (*)    Nitrite POSITIVE (*)    Leukocytes,Ua TRACE (*)    All other components within normal limits  URINALYSIS, MICROSCOPIC (REFLEX) - Abnormal; Notable for the following components:   Bacteria, UA MANY (*)    All other components within normal limits  PREGNANCY, URINE    EKG None  Radiology No results found.  Procedures Procedures  {Document cardiac monitor, telemetry assessment procedure when appropriate:1}  Medications Ordered in ED Medications  lactated ringers bolus 1,000 mL (has no administration in time range)    ED Course/ Medical Decision Making/ A&P   {   Click here for ABCD2, HEART and other calculatorsREFRESH Note before signing :1}                              Medical Decision Making Amount and/or Complexity of Data Reviewed Labs: ordered.   This patient complains of ***; this involves an extensive number of treatment Options and is a complaint that carries with it a high risk of complications and morbidity. The differential includes ***  I ordered, reviewed and interpreted labs, which included *** I ordered medication *** and reviewed PMP when indicated. I ordered imaging studies which included *** and I independently    visualized and interpreted imaging which showed *** Additional history obtained from *** Previous records obtained and reviewed *** I consulted *** and discussed lab and imaging findings and  discussed disposition.  Cardiac monitoring reviewed, *** Social determinants considered, *** Critical Interventions: ***  After the interventions stated above, I reevaluated the patient and found *** Admission and further testing considered, ***   {Document critical care time when appropriate:1} {Document review of labs and clinical decision tools ie heart score, Chads2Vasc2 etc:1}  {Document your independent review of radiology images, and any outside records:1} {Document your discussion with family members, caretakers, and with consultants:1} {Document social determinants of health affecting pt's care:1} {Document your decision making why  or why not admission, treatments were needed:1} Final Clinical Impression(s) / ED Diagnoses Final diagnoses:  None    Rx / DC Orders ED Discharge Orders     None

## 2023-07-07 NOTE — ED Triage Notes (Signed)
Sent from UC with concerns of dehydration. Patient c/o generalized body aches, fatigue, chills and dizziness onset yesterday. Negative covid/flu test at [facility].

## 2023-07-07 NOTE — Discharge Instructions (Signed)
You are seen in the emergency department for lightheadedness and bodyaches.  Your lab work was fairly unremarkable although did show signs of dehydration.  You were given IV fluids with some improvement in your symptoms.  Please continue to hydrate and rest.  Follow-up with your regular doctor.  Return to the emergency department if any worsening or concerning symptoms

## 2023-07-09 ENCOUNTER — Encounter (HOSPITAL_BASED_OUTPATIENT_CLINIC_OR_DEPARTMENT_OTHER): Payer: Self-pay

## 2023-07-09 ENCOUNTER — Other Ambulatory Visit: Payer: Self-pay

## 2023-07-09 ENCOUNTER — Emergency Department (HOSPITAL_BASED_OUTPATIENT_CLINIC_OR_DEPARTMENT_OTHER)
Admission: EM | Admit: 2023-07-09 | Discharge: 2023-07-09 | Disposition: A | Discharge status: Home / Self Care | Payer: Medicaid Other | Attending: Emergency Medicine | Admitting: Emergency Medicine

## 2023-07-09 ENCOUNTER — Emergency Department (HOSPITAL_BASED_OUTPATIENT_CLINIC_OR_DEPARTMENT_OTHER): Payer: Medicaid Other

## 2023-07-09 DIAGNOSIS — R42 Dizziness and giddiness: Secondary | ICD-10-CM | POA: Insufficient documentation

## 2023-07-09 DIAGNOSIS — G43119 Migraine with aura, intractable, without status migrainosus: Secondary | ICD-10-CM | POA: Insufficient documentation

## 2023-07-09 LAB — CBC WITH DIFFERENTIAL/PLATELET
Abs Immature Granulocytes: 0.02 10*3/uL (ref 0.00–0.07)
Basophils Absolute: 0 10*3/uL (ref 0.0–0.1)
Basophils Relative: 0 %
Eosinophils Absolute: 0.1 10*3/uL (ref 0.0–0.5)
Eosinophils Relative: 1 %
HCT: 39.5 % (ref 36.0–46.0)
Hemoglobin: 13.3 g/dL (ref 12.0–15.0)
Immature Granulocytes: 0 %
Lymphocytes Relative: 25 %
Lymphs Abs: 1.6 10*3/uL (ref 0.7–4.0)
MCH: 30.4 pg (ref 26.0–34.0)
MCHC: 33.7 g/dL (ref 30.0–36.0)
MCV: 90.4 fL (ref 80.0–100.0)
Monocytes Absolute: 0.5 10*3/uL (ref 0.1–1.0)
Monocytes Relative: 8 %
Neutro Abs: 4.2 10*3/uL (ref 1.7–7.7)
Neutrophils Relative %: 66 %
Platelets: 204 10*3/uL (ref 150–400)
RBC: 4.37 MIL/uL (ref 3.87–5.11)
RDW: 11.7 % (ref 11.5–15.5)
WBC: 6.3 10*3/uL (ref 4.0–10.5)
nRBC: 0 % (ref 0.0–0.2)

## 2023-07-09 LAB — COMPREHENSIVE METABOLIC PANEL
ALT: 11 U/L (ref 0–44)
AST: 15 U/L (ref 15–41)
Albumin: 3.9 g/dL (ref 3.5–5.0)
Alkaline Phosphatase: 45 U/L (ref 38–126)
Anion gap: 8 (ref 5–15)
BUN: 10 mg/dL (ref 6–20)
CO2: 21 mmol/L — ABNORMAL LOW (ref 22–32)
Calcium: 9.2 mg/dL (ref 8.9–10.3)
Chloride: 109 mmol/L (ref 98–111)
Creatinine, Ser: 0.7 mg/dL (ref 0.44–1.00)
GFR, Estimated: >60 mL/min (ref >60)
Glucose, Bld: 98 mg/dL (ref 70–99)
Potassium: 3.7 mmol/L (ref 3.5–5.1)
Sodium: 138 mmol/L (ref 135–145)
Total Bilirubin: 0.5 mg/dL (ref 0.3–1.2)
Total Protein: 7.6 g/dL (ref 6.5–8.1)

## 2023-07-09 LAB — CBG MONITORING, ED: Glucose-Capillary: 90 mg/dL (ref 70–99)

## 2023-07-09 MED ORDER — MAGNESIUM SULFATE 2 GM/50ML IV SOLN
2.0000 g | Freq: Once | INTRAVENOUS | Status: AC
  Administered 2023-07-09: 2 g via INTRAVENOUS
  Filled 2023-07-09: qty 50

## 2023-07-09 MED ORDER — KETOROLAC TROMETHAMINE 15 MG/ML IJ SOLN
15.0000 mg | Freq: Once | INTRAMUSCULAR | Status: AC
  Administered 2023-07-09: 15 mg via INTRAVENOUS
  Filled 2023-07-09: qty 1

## 2023-07-09 MED ORDER — DEXAMETHASONE SODIUM PHOSPHATE 4 MG/ML IJ SOLN
4.0000 mg | Freq: Once | INTRAMUSCULAR | Status: AC
  Administered 2023-07-09: 4 mg via INTRAVENOUS
  Filled 2023-07-09: qty 1

## 2023-07-09 MED ORDER — MECLIZINE HCL 25 MG PO TABS
25.0000 mg | ORAL_TABLET | Freq: Once | ORAL | Status: AC
  Administered 2023-07-09: 25 mg via ORAL
  Filled 2023-07-09: qty 1

## 2023-07-09 MED ORDER — KETOROLAC TROMETHAMINE 15 MG/ML IJ SOLN
30.0000 mg | Freq: Once | INTRAMUSCULAR | Status: AC
  Administered 2023-07-09: 30 mg via INTRAVENOUS
  Filled 2023-07-09: qty 2

## 2023-07-09 MED ORDER — MECLIZINE HCL 25 MG PO TABS: 25.0000 mg | ORAL_TABLET | Freq: Three times a day (TID) | ORAL | 0 refills | Status: AC | PRN

## 2023-07-09 MED ORDER — METOCLOPRAMIDE HCL 5 MG/ML IJ SOLN
10.0000 mg | Freq: Once | INTRAMUSCULAR | Status: AC
  Administered 2023-07-09: 10 mg via INTRAVENOUS
  Filled 2023-07-09: qty 2

## 2023-07-09 MED ORDER — DIPHENHYDRAMINE HCL 50 MG/ML IJ SOLN
12.5000 mg | Freq: Once | INTRAMUSCULAR | Status: AC
  Administered 2023-07-09: 12.5 mg via INTRAVENOUS
  Filled 2023-07-09: qty 1

## 2023-07-09 MED ORDER — SODIUM CHLORIDE 0.9 % IV BOLUS
1000.0000 mL | Freq: Once | INTRAVENOUS | Status: AC
  Administered 2023-07-09: 1000 mL via INTRAVENOUS

## 2023-07-09 NOTE — ED Triage Notes (Addendum)
Pt reports dizziness, shaky and weak while driving to work today. Fingers and face became tingly Seen yesterday for same symptoms

## 2023-07-09 NOTE — ED Provider Notes (Signed)
[place] [facility] Provider Note   CSN: [id number] Arrival date & time: 07/09/23  1017     History  Chief Complaint  Patient presents with   Dizziness    Carol Pitts is a 32 y.o. female with a history of depression and migraines presents the ED today for dizziness. Patient reports she was seen in the ED 2 days ago for same complaint.  She states that she was told that she was "severely dehydrated" and given 1 bag of fluids and discharged home.  Patient reports that her dizziness has persisted since.  This morning she was driving to work when she experienced feeling dizziness as well as nausea and numbness to her fingers and lips. Patient describes the dizziness as if she is spinning. She has never experienced the numbness sensation before.  She reports the numbness improved but there is some dizziness still as well as a migraine.  Reports the migraine is behind her left eye and persistent for the past several days.  She sees a neurologist for migraines and takes both rizatriptan and topiramate which have not helped improve her migraine.  Denies any changes to her vision, weakness, decreased sensation, or fever.  Denies any head injury or trauma.  No other complaints or concerns at this time.    Home Medications Prior to Admission medications   Medication Sig Start Date End Date Taking? Authorizing Provider  meclizine (ANTIVERT) 25 MG tablet Take 1 tablet (25 mg total) by mouth 3 (three) times daily as needed for dizziness. 07/09/23 08/08/23 Yes Maxwell Marion, PA-C  acetaminophen (TYLENOL) 500 MG tablet Take 500-1,000 mg by mouth every 6 (six) hours as needed (tooth pain).    [provider]  cyclobenzaprine (FLEXERIL) 10 MG tablet Take 1 tablet (10 mg total) by mouth 2 (two) times daily as needed for muscle spasms. 01/02/23   Clark, Meghan R, PA-C  methylPREDNISolone (MEDROL) 4 MG tablet Taper from 6 pills po for one day to 1 pill po the last day  over 6 days 02/26/23   Sater, Pearletha Furl, MD  ondansetron (ZOFRAN) 4 MG tablet Take 1 tablet (4 mg total) by mouth every 6 (six) hours. 01/01/23   Loetta Rough, MD  prochlorperazine (COMPAZINE) 10 MG tablet Take 1 tablet (10 mg total) by mouth 2 (two) times daily as needed for nausea or vomiting. 01/02/23   Melton Alar R, PA-C  rizatriptan (MAXALT-MLT) 10 MG disintegrating tablet Take 1 tablet (10 mg total) by mouth as needed for migraine. May repeat in 2 hours if needed.  No more than 2/day or 4/week 02/26/23   Sater, Pearletha Furl, MD  topiramate (TOPAMAX) 100 MG tablet Take 2 tablets by mouth at bedtime 03/13/23   Sater, Pearletha Furl, MD      Allergies    Patient has no known allergies.    Review of Systems   Review of Systems  All other systems reviewed and are negative.   Physical Exam Updated Vital Signs BP 95/63   Pulse (!) 57   Temp 97.7 F (36.5 C)   Resp 13   Ht 5\' 3"  (1.6 m)   Wt 59 kg   LMP 06/28/2023 (Exact Date)   SpO2 100%   BMI 23.03 kg/m  Physical Exam Vitals and nursing note reviewed.  Constitutional:      General: She is not in acute distress.    Appearance: Normal appearance.  HENT:     Head: Normocephalic and atraumatic.  Mouth/Throat:     Mouth: Mucous membranes are moist.  Eyes:     Extraocular Movements: Extraocular movements intact.     Conjunctiva/sclera: Conjunctivae normal.     Pupils: Pupils are equal, round, and reactive to light.     Comments: No nystagmus with EOM. Negative HINTS exam.  Cardiovascular:     Rate and Rhythm: Normal rate and regular rhythm.     Pulses: Normal pulses.     Heart sounds: Normal heart sounds.  Pulmonary:     Effort: Pulmonary effort is normal.     Breath sounds: Normal breath sounds.  Abdominal:     Palpations: Abdomen is soft.     Tenderness: There is no abdominal tenderness.  Skin:    General: Skin is warm and dry.     Findings: No rash.  Neurological:     General: No focal deficit present.     Mental  Status: She is alert.     Sensory: No sensory deficit.     Motor: No weakness.     Gait: Gait normal.     Comments: Patient ambulated normally in the room.  Upper and lower extremity strength and sensation intact.  Psychiatric:        Mood and Affect: Mood normal.        Behavior: Behavior normal.     ED Results / Procedures / Treatments   Labs (all labs ordered are listed, but only abnormal results are displayed) Labs Reviewed  COMPREHENSIVE METABOLIC PANEL - Abnormal; Notable for the following components:      Result Value   CO2 21 (*)    All other components within normal limits  CBC WITH DIFFERENTIAL/PLATELET  PREGNANCY, URINE  CBG MONITORING, ED    EKG EKG Interpretation Date/Time:  Monday July 09 2023 11:09:53 EDT Ventricular Rate:  60 PR Interval:  121 QRS Duration:  107 QT Interval:  407 QTC Calculation: 407 R Axis:   46  Text Interpretation: Sinus rhythm Confirmed by Ernie Avena ([id number]) on 07/09/2023 1:25:25 PM  Radiology CT Head Wo Contrast  Result Date: 07/09/2023 CLINICAL DATA:  Worsening headache, dizziness, and weakness. Facial and hand paresthesias. EXAM: CT HEAD WITHOUT CONTRAST TECHNIQUE: Contiguous axial images were obtained from the base of the skull through the vertex without intravenous contrast. RADIATION DOSE REDUCTION: This exam was performed according to the departmental dose-optimization program which includes automated exposure control, adjustment of the mA and/or kV according to patient size and/or use of iterative reconstruction technique. COMPARISON:  01/01/2023 FINDINGS: Brain: No evidence of intracranial hemorrhage, acute infarction, hydrocephalus, extra-axial collection, or mass lesion/mass effect. Vascular:  No hyperdense vessel or other acute findings. Skull: No evidence of fracture or other significant bone abnormality. Sinuses/Orbits:  No acute findings. Other: None. IMPRESSION: Negative noncontrast head CT. Electronically Signed   By:  Danae Orleans M.D.   On: 07/09/2023 14:31    Procedures Procedures: not indicated.   Medications Ordered in ED Medications  sodium chloride 0.9 % bolus 1,000 mL (0 mLs Intravenous Stopped 07/09/23 1300)  ketorolac (TORADOL) 15 MG/ML injection 15 mg (15 mg Intravenous Given 07/09/23 1154)  metoCLOPramide (REGLAN) injection 10 mg (10 mg Intravenous Given 07/09/23 1152)  diphenhydrAMINE (BENADRYL) injection 12.5 mg (12.5 mg Intravenous Given 07/09/23 1150)  meclizine (ANTIVERT) tablet 25 mg (25 mg Oral Given 07/09/23 1142)  magnesium sulfate IVPB 2 g 50 mL (0 g Intravenous Stopped 07/09/23 1500)  dexamethasone (DECADRON) injection 4 mg (4 mg Intravenous Given 07/09/23 1328)  ketorolac (TORADOL)  15 MG/ML injection 30 mg (30 mg Intravenous Given 07/09/23 1326)    ED Course/ Medical Decision Making/ A&P                                 Medical Decision Making Amount and/or Complexity of Data Reviewed Labs: ordered. Radiology: ordered.  Risk Prescription drug management.   This patient presents to the ED for concern of dizziness, this involves an extensive number of treatment options, and is a complaint that carries with it a high risk of complications and morbidity.   Differential diagnosis includes: BPPV, Menire disease, labyrinthitis, vestibular neuritis, electrolyte abnormality, dehydration, anemia, migraine, atypical migraine, cluster headache, tension headache, etc.  Comorbidities  See HPI above   Additional History  Additional history obtained from patient's previous records.   Cardiac Monitoring / EKG  The patient was maintained on a cardiac monitor.  I personally viewed and interpreted the cardiac monitored which showed: sinus rhythm with a heart rate of 60 bpm.   Lab Tests  I ordered and personally interpreted labs.  The pertinent results include:   CMP and CBC are within normal limits - no acute electrolyte abnormality, AKI, infection, or anemia. CBG of 90   Imaging  Studies  I ordered imaging studies including CT head without contrast  I independently visualized and interpreted imaging which showed: No acute findings I agree with the radiologist interpretation   Problem List / ED Course / Critical Interventions / Medication Management  Dizziness and migraine I ordered medications including: Meclizine for vertigo  Benadryl, Reglan, Ketoralac, Magnesium, Decadron, and NS for headache Reevaluation of the patient after these medicines showed that the patient slightly improved We tried high flow O2 via nasal cannula which did not improve headache behind eye I have reviewed the patients home medicines and have made adjustments as needed   Social Determinants of Health  Tobacco use   Test / Admission - Considered  Discussed results with patient and husband at bedside. She is hemodynamically stable and safe for discharge home. Prescription for Meclizine sent to the pharmacy. Return precautions given.       Final Clinical Impression(s) / ED Diagnoses Final diagnoses:  Dizziness  Intractable migraine with aura without status migrainosus    Rx / DC Orders ED Discharge Orders          Ordered    meclizine (ANTIVERT) 25 MG tablet  3 times daily PRN        07/09/23 1601              Maxwell Marion, PA-C 07/09/23 1800    Ernie Avena, MD 07/10/23 [phone]

## 2023-07-09 NOTE — Discharge Instructions (Addendum)
As discussed, labs and imaging are unremarkable. There were no acute findings.  I have sent a prescription of meclizine to your pharmacy.  You can take this up to 3 times a day as needed for dizziness.   Please follow-up with your neurologist in the next 3 to 5 days for reevaluation of your symptoms.  Get help right away if: You vomit or have watery poop (diarrhea), and you cannot eat or drink anything. You have trouble: Talking. Walking. Swallowing. Using your arms, hands, or legs. You feel generally weak. You are not thinking clearly, or you have trouble forming sentences. A friend or family member may notice this. You have: Chest pain. Pain in your belly (abdomen). Shortness of breath. Sweating. Your vision changes. You are bleeding. You have a very bad headache.

## 2023-07-09 NOTE — ED Notes (Signed)
Discharge paperwork reviewed entirely with patient, including follow up care. Pain was under control. The patient received instruction and coaching on their prescriptions, and all follow-up questions were answered.  Pt verbalized understanding as well as all parties involved. No questions or concerns voiced at the time of discharge. No acute distress noted.   Pt ambulated out to PVA without incident or assistance.  

## 2023-07-09 NOTE — ED Notes (Signed)
Pt placed on Apex at 2LPM for O2 therapy for persistent migraine pain.

## 2023-09-03 ENCOUNTER — Ambulatory Visit (INDEPENDENT_AMBULATORY_CARE_PROVIDER_SITE_OTHER): Payer: Medicaid Other | Admitting: Neurology

## 2023-09-03 ENCOUNTER — Encounter: Payer: Self-pay | Admitting: Neurology

## 2023-09-03 ENCOUNTER — Telehealth: Payer: Self-pay | Admitting: *Deleted

## 2023-09-03 VITALS — BP 108/61 | HR 75 | Ht 63.0 in | Wt 139.5 lb

## 2023-09-03 DIAGNOSIS — Z79899 Other long term (current) drug therapy: Secondary | ICD-10-CM | POA: Diagnosis not present

## 2023-09-03 DIAGNOSIS — G43709 Chronic migraine without aura, not intractable, without status migrainosus: Secondary | ICD-10-CM

## 2023-09-03 DIAGNOSIS — R519 Headache, unspecified: Secondary | ICD-10-CM

## 2023-09-03 MED ORDER — EMGALITY 120 MG/ML ~~LOC~~ SOAJ: 1.0000 | SUBCUTANEOUS | 4 refills | Status: DC

## 2023-09-03 MED ORDER — RIZATRIPTAN BENZOATE 10 MG PO TBDP: 10.0000 mg | ORAL_TABLET | ORAL | 11 refills | Status: DC | PRN

## 2023-09-03 NOTE — Progress Notes (Signed)
[facility]  PATIENT: Carol Pitts DOB: [birth year]  REFERRING DOCTOR OR PCP: Referred from emergency room SOURCE: Patient, notes from recent emergency room visit, imaging and lab reports, CT scan images personally reviewed.  _________________________________   HISTORICAL  CHIEF COMPLAINT:  Chief Complaint  Patient presents with   Follow-up    Pt in room 10 alone. Here for migraine follow up. Pt takes topamax and maxalt. Pt reports migraines are okay, Pt went to ER on 07/09/23, reports the Topamax doesn't help much with migraines. Pt reports 4-5 migraines per month.     HISTORY OF PRESENT ILLNESS:  Carol Pitts is a 32 y.o. woman with chronic migraines headaches.  UPDATE 09/03/2023: She is still having 3 migraines a month despite Topamax 200 mg nightly.  She was 10 days/month prior to topiramate.   When a migraine occurs, she takes Maxalt that helps about 80% of the time.  A couple ties a year she goes to the ED for a migraine cocktail.  With her migraines, she gets nausea (rare vomiting) phonophobia and photophobia.   Moving her head intensifies the pain.   Pain is usually frontal, R>L.  Her worse HA was also associated with occipital pain (either side).   HA is usually better if she sleeps.     Migraine medications tried:  Topiramate, muscle relaxants  Others:  Maxalt when migraine occurs often helps but not always.  Has never been on an anti-CGRP agent.    She has had tubes tied (has 3 kids).  HA History: She has migrainous headaches that worsened in late 2023.   Earlier, she would have a few migraines a month and an OTC NSAID or analgesic would knock the pain out.    She is now experiencing 1-3 a week and they last 3 days each.     She presented to the emergency room 01/01/2023 with several days of holocephalic headache with throbbing quality and associated with nausea, vomiting and photophobia.  In the emergency room,  she was given a cocktail of  acetaminophen, ketorolac, diphenhydramine and promethazine.  CT scan of the head was performed which was normal.  She had some upper respiratory symptoms and COVID-19 and influenza were tested and negative.  She was discharged.  She went back to the emergency room the next day.  Laboratory tests were obtained (negative CBC and BMP) she was given diphenhydramine, prochlorperazine, ketorolac and dexamethasone and discharged.   Imaging: CT scan of the head 01/01/2023 showed no acute findings.  Incidental note of choroid plexus xanthogranulomatas, a benign finding.     REVIEW OF SYSTEMS: Constitutional: No fevers, chills, sweats, or change in appetite Eyes: No visual changes, double vision, eye pain Ear, nose and throat: No hearing loss, ear pain, nasal congestion, sore throat Cardiovascular: No chest pain, palpitations Respiratory:  No shortness of breath at rest or with exertion.   No wheezes GastrointestinaI: No nausea, vomiting, diarrhea, abdominal pain, fecal incontinence Genitourinary:  No dysuria, urinary retention or frequency.  No nocturia. Musculoskeletal:  No neck pain, back pain Integumentary: No rash, pruritus, skin lesions Neurological: as above Psychiatric: No depression at this time.  No anxiety Endocrine: No palpitations, diaphoresis, change in appetite, change in weigh or increased thirst Hematologic/Lymphatic:  No anemia, purpura, petechiae. Allergic/Immunologic: No itchy/runny eyes, nasal congestion, recent allergic reactions, rashes  ALLERGIES: No Known Allergies  HOME MEDICATIONS:  Current Outpatient Medications:    Galcanezumab-gnlm (EMGALITY) 120 MG/ML SOAJ, Inject 1 Pen into the skin every 28 (  twenty-eight) days., Disp: 3 mL, Rfl: 4   topiramate (TOPAMAX) 100 MG tablet, Take 2 tablets by mouth at bedtime, Disp: 60 tablet, Rfl: 5   rizatriptan (MAXALT-MLT) 10 MG disintegrating tablet, Take 1 tablet (10 mg total) by mouth as needed for migraine. May repeat in 2 hours  if needed.  No more than 2/day or 4/week, Disp: 10 tablet, Rfl: 11  PAST MEDICAL HISTORY: Past Medical History:  Diagnosis Date   Depression    no meds   Migraine     PAST SURGICAL HISTORY: Past Surgical History:  Procedure Laterality Date   ALVEOLOPLASTY  08/18/2021   Procedure: ALVEOLOPLASTY;  Surgeon: Ocie Doyne, DMD;  Location[facility];  Service: Oral Surgery;;   DILATION AND EVACUATION  01/31/2018   TOOTH EXTRACTION N/A 08/18/2021   Procedure: DENTAL RESTORATION/EXTRACTIONS;  Surgeon: Ocie Doyne, DMD;  Location: [facility] OR;  Service: Oral Surgery;  Laterality: N/A;   TUBAL LIGATION  06/2019   laparoscopic   WISDOM TOOTH EXTRACTION      FAMILY HISTORY: No family history on file.  SOCIAL HISTORY: Social History   Socioeconomic History   Marital status: Single    Spouse name: Not on file   Number of children: Not on file   Years of education: Not on file   Highest education level: Not on file  Occupational History   Not on file  Tobacco Use   Smoking status: Every Day    Current packs/day: 0.75    Average packs/day: 0.8 packs/day for 12.0 years (9.0 ttl pk-yrs)    Types: Cigarettes   Smokeless tobacco: Never  Vaping Use   Vaping status: Never Used  Substance and Sexual Activity   Alcohol use: No   Drug use: Yes    Types: Marijuana    Comment: Last use 08/17/21   Sexual activity: Not on file    Comment: tubal ligation  Other Topics Concern   Not on file  Social History Narrative   Right Handed   1 Cup of Coffee per Day   2-3 Sodas per Day   Social Determinants of Health   Financial Resource Strain: Not on file  Food Insecurity: Not on file  Transportation Needs: Not on file  Physical Activity: Not on file  Stress: Not on file  Social Connections: Not on file  Intimate Partner Violence: Not on file       PHYSICAL EXAM  Vitals:   09/03/23 0939  BP: 108/61  Pulse: 75  Weight: 139 lb 8 oz (63.3 kg)  Height: 5\' 3"  (1.6 m)    Body mass  index is 24.71 kg/m.   General: The patient is well-developed and well-nourished and in no acute distress  HEENT:  Head is [place]/AT.  Sclera are anicteric.     Neck: No carotid bruits are noted.  The neck is tender  at the occiput, left > right.    Skin: Extremities are without rash or  edema.  Musculoskeletal:  Back is nontender  Neurologic Exam  Mental status: The patient is alert and oriented x 3 at the time of the examination. The patient has apparent normal recent and remote memory, with an apparently normal attention span and concentration ability.   Speech is normal.  Cranial nerves: Extraocular movements are full. Pupils are equal, round, and reactive to light and accomodation.   . There is good facial sensation to soft touch bilaterally.Facial strength is normal.  Trapezius and sternocleidomastoid strength is normal. No dysarthria is noted.  No obvious hearing deficits are noted.  Motor:  Muscle bulk is normal.   Tone is normal. Strength is  5 / 5 in all 4 extremities.   Sensory: Sensory testing is intact to pinprick, soft touch and vibration sensation in all 4 extremities.  Coordination: Cerebellar testing reveals good finger-nose-finger and heel-to-shin bilaterally.  Gait and station: Station is normal.   Gait is normal. Tandem gait is normal. Romberg is negative.   Reflexes: Deep tendon reflexes are symmetric and normal bilaterally.       DIAGNOSTIC DATA (LABS, IMAGING, TESTING) - I reviewed patient records, labs, notes, testing and imaging myself where available.  Lab Results  Component Value Date   WBC 6.3 07/09/2023   HGB 13.3 07/09/2023   HCT 39.5 07/09/2023   MCV 90.4 07/09/2023   PLT 204 07/09/2023      Component Value Date/Time   NA 138 07/09/2023 1124   K 3.7 07/09/2023 1124   CL 109 07/09/2023 1124   CO2 21 (L) 07/09/2023 1124   GLUCOSE 98 07/09/2023 1124   BUN 10 07/09/2023 1124   CREATININE 0.70 07/09/2023 1124   CALCIUM 9.2 07/09/2023 1124    PROT 7.6 07/09/2023 1124   ALBUMIN 3.9 07/09/2023 1124   AST 15 07/09/2023 1124   ALT 11 07/09/2023 1124   ALKPHOS 45 07/09/2023 1124   BILITOT 0.5 07/09/2023 1124   GFRNONAA >60 07/09/2023 1124   GFRAA >60 05/05/2018 0827       ASSESSMENT AND PLAN  Chronic migraine w/o aura, not intractable, w/o stat migr  Occipital pain  High risk medication use   Change to Emgality - sample x 2 (loading dose)   ([id number] E; 05/14/2025)   Patient has had tubes tied so bHCG not tested. Rizatriptan 10 mg as needed as needed migraine She will return to see [place] in 12 months for regular visit but should call if the above interventions are not beneficial or if there is a change in the quality or frequency of the headaches     Ryot Burrous A. Epimenio Foot, MD, [facility] 09/03/2023, 10:16 AM Certified in Neurology, Clinical Neurophysiology, Sleep Medicine and Neuroimaging  [facility] [address] [place], [place] [address] [phone]

## 2023-09-10 ENCOUNTER — Other Ambulatory Visit (HOSPITAL_COMMUNITY): Payer: Self-pay

## 2023-09-10 ENCOUNTER — Telehealth: Payer: Self-pay

## 2023-09-10 NOTE — Telephone Encounter (Signed)
PA request has been Submitted. New Encounter created for follow up. For additional info see Pharmacy Prior Auth telephone encounter from 09/10/2023.

## 2023-09-10 NOTE — Telephone Encounter (Signed)
Pharmacy Patient Advocate Encounter   Received notification from Physician's Office that prior authorization for Emgality 120MG /ML auto-injectors (migraine) is required/requested.   Insurance verification completed.   The patient is insured through [facility] [place] .   Per test claim: PA required; PA submitted to above mentioned insurance via CoverMyMeds Key/confirmation #/EOC [id number] Status is pending

## 2023-09-12 NOTE — Telephone Encounter (Signed)
Pharmacy Patient Advocate Encounter  Received notification from [facility] Medicaid that Prior Authorization for Emgality 120MG /ML auto-injectors (migraine) has been DENIED.  Full denial letter will be uploaded to the media tab. See denial reason below.    PA #/Case ID/Reference #: [id number]

## 2023-09-12 NOTE — Telephone Encounter (Signed)
Dr. Epimenio Foot- I see where he has tried/failed: Topamax.   I also see she was on zoloft in the past but rx'd for anxiety depression. Stopped d/t pregnancy.   Is there another medication you would like to offer since Emgality denied?

## 2023-09-14 NOTE — Telephone Encounter (Signed)
Unable to resubmit due to previous denial.

## 2023-09-17 NOTE — Telephone Encounter (Signed)
LVM for pt relaying mychart message. Asked her to call back or reply to [employer]

## 2023-09-17 NOTE — Telephone Encounter (Signed)
Appeal letter written, waiting on MD signature. 

## 2023-09-18 NOTE — Telephone Encounter (Signed)
LMVM for pt to return call or email response about Emgality appeal form that needs her signature.

## 2023-09-20 NOTE — Telephone Encounter (Signed)
I called pt and she is ok for [place] to send her form on her email listed below for consent for [place] to work on the appeal for emagality.  She will then upload to mychart once signed.  She thanked me for calling. Emailed to [email or web address]

## 2023-09-24 NOTE — Telephone Encounter (Signed)
I emailed consent to patient email listed below.

## 2023-09-26 ENCOUNTER — Other Ambulatory Visit (HOSPITAL_COMMUNITY): Payer: Self-pay

## 2023-09-26 NOTE — Telephone Encounter (Signed)
Pharmacy Patient Advocate Encounter  Received notification from [facility] Medicaid that Prior Authorization for Emgality 120mg /ml has been APPROVED from 09/10/2023 to 12/25/2023. Ran test claim, Copay is $0.00. This test claim was processed through [facility]- copay amounts may vary at other pharmacies due to pharmacy/plan contracts, or as the patient moves through the different stages of their insurance plan.   PA #/Case ID/Reference #: [id number]

## 2024-09-03 ENCOUNTER — Encounter: Payer: Self-pay | Admitting: Neurology

## 2024-09-03 ENCOUNTER — Ambulatory Visit: Payer: Medicaid Other | Admitting: Neurology

## 2024-09-03 VITALS — BP 119/75 | HR 77 | Ht 63.0 in | Wt 154.5 lb

## 2024-09-03 DIAGNOSIS — G43709 Chronic migraine without aura, not intractable, without status migrainosus: Secondary | ICD-10-CM | POA: Insufficient documentation

## 2024-09-03 DIAGNOSIS — R519 Headache, unspecified: Secondary | ICD-10-CM

## 2024-09-03 MED ORDER — RIZATRIPTAN BENZOATE 10 MG PO TBDP: 10.0000 mg | ORAL_TABLET | ORAL | 11 refills | Status: AC | PRN

## 2024-09-03 MED ORDER — NORTRIPTYLINE HCL 25 MG PO CAPS: 25.0000 mg | ORAL_CAPSULE | Freq: Every day | ORAL | 11 refills | Status: AC

## 2024-09-03 MED ORDER — PROMETHAZINE HCL 25 MG PO TABS: 25.0000 mg | ORAL_TABLET | Freq: Three times a day (TID) | ORAL | 11 refills | Status: AC | PRN

## 2024-09-03 MED ORDER — TOPIRAMATE 100 MG PO TABS: ORAL_TABLET | ORAL | 11 refills | Status: AC

## 2024-09-03 NOTE — Progress Notes (Signed)
 [facility]  PATIENT: Carol Pitts DOB: 21-Jan-1991  REFERRING DOCTOR OR PCP: Referred from emergency room SOURCE: Patient, notes from recent emergency room visit, imaging and lab reports, CT scan images personally reviewed.  _________________________________   HISTORICAL  CHIEF COMPLAINT:  Chief Complaint  Patient presents with   Follow-up    Pt in room 11. Alone. Here for migraine follow up.    HISTORY OF PRESENT ILLNESS:  Carol Pitts is a 33 y.o. woman with chronic migraines headaches.  UPDATE 09/03/2024: She is still having 2 migraines a month but they can last a week.    She has been on Topamax  200 mg nightly.   When a migraine occurs, she takes Maxalt  that helps about 80% of the time.  A couple ties a year she goes to the ED for a migraine cocktail.  We tried Emgality  last visit but she did not note a benefit and shot caused bruising so she never took another month.    With her migraines, she gets nausea (rare vomiting) phonophobia and photophobia.   Moving her head intensifies the pain.   Pain is usually frontal, R>L.  Her worse HA was also associated with occipital pain (either side).   HA is usually better if she sleeps.     Migraine medications tried:  Topiramate , muscle relaxants, Emgality   Others:  Maxalt  when migraine occurs often helps but not always.    She has had tubes tied (has 3 kids).  HA History: She has migrainous headaches that worsened in late 2023.   Earlier, she would have a few migraines a month and an OTC NSAID or analgesic would knock the pain out.    She is now experiencing 1-3 a week and they last 3 days each.     She presented to the emergency room 01/01/2023 with several days of holocephalic headache with throbbing quality and associated with nausea, vomiting and photophobia.  In the emergency room,  she was given a cocktail of acetaminophen , ketorolac , diphenhydramine  and promethazine .  CT scan of the head was  performed which was normal.  She had some upper respiratory symptoms and COVID-19 and influenza were tested and negative.  She was discharged.  She went back to the emergency room the next day.  Laboratory tests were obtained (negative CBC and BMP) she was given diphenhydramine , prochlorperazine , ketorolac  and dexamethasone  and discharged.   Imaging: CT scan of the head 01/01/2023 showed no acute findings.  Incidental note of choroid plexus xanthogranulomatas, a benign finding.     REVIEW OF SYSTEMS: Constitutional: No fevers, chills, sweats, or change in appetite Eyes: No visual changes, double vision, eye pain Ear, nose and throat: No hearing loss, ear pain, nasal congestion, sore throat Cardiovascular: No chest pain, palpitations Respiratory:  No shortness of breath at rest or with exertion.   No wheezes GastrointestinaI: No nausea, vomiting, diarrhea, abdominal pain, fecal incontinence Genitourinary:  No dysuria, urinary retention or frequency.  No nocturia. Musculoskeletal:  No neck pain, back pain Integumentary: No rash, pruritus, skin lesions Neurological: as above Psychiatric: No depression at this time.  No anxiety Endocrine: No palpitations, diaphoresis, change in appetite, change in weigh or increased thirst Hematologic/Lymphatic:  No anemia, purpura, petechiae. Allergic/Immunologic: No itchy/runny eyes, nasal congestion, recent allergic reactions, rashes  ALLERGIES: No Known Allergies  HOME MEDICATIONS:  Current Outpatient Medications:    nortriptyline (PAMELOR) 25 MG capsule, Take 1 capsule (25 mg total) by mouth at bedtime., Disp: 30 capsule, Rfl: 11  promethazine  (PHENERGAN ) 25 MG tablet, Take 1 tablet (25 mg total) by mouth every 8 (eight) hours as needed for nausea or vomiting., Disp: 30 tablet, Rfl: 11   rizatriptan  (MAXALT -MLT) 10 MG disintegrating tablet, Take 1 tablet (10 mg total) by mouth as needed for migraine. May repeat in 2 hours if needed.  No more than  2/day or 4/week, Disp: 10 tablet, Rfl: 11   topiramate  (TOPAMAX ) 100 MG tablet, Take 2 tablets by mouth at bedtime, Disp: 60 tablet, Rfl: 11  PAST MEDICAL HISTORY: Past Medical History:  Diagnosis Date   Depression    no meds   Migraine     PAST SURGICAL HISTORY: Past Surgical History:  Procedure Laterality Date   ALVEOLOPLASTY  08/18/2021   Procedure: ALVEOLOPLASTY;  Surgeon: Sheryle Hamilton, DMD;  Location: [facility] OR;  Service: Oral Surgery;;   DILATION AND EVACUATION  01/31/2018   TOOTH EXTRACTION N/A 08/18/2021   Procedure: DENTAL RESTORATION/EXTRACTIONS;  Surgeon: Sheryle Hamilton, DMD;  Location: [facility] OR;  Service: Oral Surgery;  Laterality: N/A;   TUBAL LIGATION  06/2019   laparoscopic   WISDOM TOOTH EXTRACTION      FAMILY HISTORY: History reviewed. No pertinent family history.  SOCIAL HISTORY: Social History   Socioeconomic History   Marital status: Single    Spouse name: Not on file   Number of children: Not on file   Years of education: Not on file   Highest education level: Not on file  Occupational History   Not on file  Tobacco Use   Smoking status: Every Day    Current packs/day: 0.75    Average packs/day: 0.8 packs/day for 12.0 years (9.0 ttl pk-yrs)    Types: Cigarettes   Smokeless tobacco: Never  Vaping Use   Vaping status: Never Used  Substance and Sexual Activity   Alcohol use: No   Drug use: Yes    Types: Marijuana    Comment: Last use 08/17/21   Sexual activity: Not on file    Comment: tubal ligation  Other Topics Concern   Not on file  Social History Narrative   Right Handed   1 Cup of Coffee per Day   2-3 Sodas per Day   Social Drivers of Health   Financial Resource Strain: Not on file  Food Insecurity: Not on file  Transportation Needs: Not on file  Physical Activity: Not on file  Stress: Not on file  Social Connections: Not on file  Intimate Partner Violence: Not on file       PHYSICAL EXAM  Vitals:   09/03/24 1009  BP:  119/75  Pulse: 77  Weight: 154 lb 8 oz (70.1 kg)  Height: 5' 3 (1.6 m)    Body mass index is 27.37 kg/m.   General: The patient is well-developed and well-nourished and in no acute distress  HEENT:  Head is [place]/AT.  Sclera are anicteric.     Neck: No carotid bruits are noted.  The neck is tender  at the occiput, left > right.    Skin: Extremities are without rash or  edema.  Musculoskeletal:  Back is nontender  Neurologic Exam  Mental status: The patient is alert and oriented x 3 at the time of the examination. The patient has apparent normal recent and remote memory, with an apparently normal attention span and concentration ability.   Speech is normal.  Cranial nerves: Extraocular movements are full  . There is good facial sensation to soft touch bilaterally.Facial strength is normal.  Trapezius  and sternocleidomastoid strength is normal. No dysarthria is noted.    No obvious hearing deficits are noted.  Motor:  Muscle bulk is normal.   Tone is normal. Strength is  5 / 5 in arms  Sensory: Sensory testing is intact to pinprick, soft touch and vibration sensation in all 4 extremities.  Coordination: Cerebellar testing reveals good finger-nose-finger and heel-to-shin bilaterally.  Gait and station: Station is normal.   Gait is normal. Tandem gait is normal.    Reflexes: Deep tendon reflexes are symmetric and normal bilaterally.       DIAGNOSTIC DATA (LABS, IMAGING, TESTING) - I reviewed patient records, labs, notes, testing and imaging myself where available.  Lab Results  Component Value Date   WBC 6.3 07/09/2023   HGB 13.3 07/09/2023   HCT 39.5 07/09/2023   MCV 90.4 07/09/2023   PLT 204 07/09/2023      Component Value Date/Time   NA 138 07/09/2023 1124   K 3.7 07/09/2023 1124   CL 109 07/09/2023 1124   CO2 21 (L) 07/09/2023 1124   GLUCOSE 98 07/09/2023 1124   BUN 10 07/09/2023 1124   CREATININE 0.70 07/09/2023 1124   CALCIUM 9.2 07/09/2023 1124   PROT 7.6  07/09/2023 1124   ALBUMIN 3.9 07/09/2023 1124   AST 15 07/09/2023 1124   ALT 11 07/09/2023 1124   ALKPHOS 45 07/09/2023 1124   BILITOT 0.5 07/09/2023 1124   GFRNONAA >60 07/09/2023 1124   GFRAA >60 05/05/2018 0827       ASSESSMENT AND PLAN  Occipital pain  Chronic migraine w/o aura, not intractable, w/o stat migr - Plan: topiramate  (TOPAMAX ) 100 MG tablet   For migraine prophylaxis I will add nortriptyline to the topiramate .   Consider Qulipta in future.    Rizatriptan  10 mg as needed as needed migraine.  Add prn promethazine  for N/V (if not helpful consider Zofran ) She will return to see us  in 12 months for regular visit but should call if the above interventions are not beneficial or if there is a change in the quality or frequency of the headaches     Michalla Ringer A. Vear, MD, [facility] 09/03/2024, 10:34 AM Certified in Neurology, Clinical Neurophysiology, Sleep Medicine and Neuroimaging  [facility] [address] [place], [place] [address] [phone]

## 2024-09-05 ENCOUNTER — Encounter: Payer: Self-pay | Admitting: Neurology
